# Patient Record
Sex: Female | Born: 1940 | Race: White | Hispanic: No | Marital: Married | Smoking: Former smoker
Health system: Southern US, Community
[De-identification: ages and names within clinical notes are randomized; demographics above are authoritative.]

## PROBLEM LIST (undated history)

## (undated) DIAGNOSIS — E785 Hyperlipidemia, unspecified: Secondary | ICD-10-CM

## (undated) DIAGNOSIS — I739 Peripheral vascular disease, unspecified: Secondary | ICD-10-CM

## (undated) DIAGNOSIS — E119 Type 2 diabetes mellitus without complications: Secondary | ICD-10-CM

## (undated) DIAGNOSIS — Z720 Tobacco use: Secondary | ICD-10-CM

## (undated) DIAGNOSIS — I10 Essential (primary) hypertension: Secondary | ICD-10-CM

## (undated) DIAGNOSIS — I779 Disorder of arteries and arterioles, unspecified: Secondary | ICD-10-CM

## (undated) DIAGNOSIS — M810 Age-related osteoporosis without current pathological fracture: Secondary | ICD-10-CM

## (undated) HISTORY — DX: Peripheral vascular disease, unspecified: I73.9

## (undated) HISTORY — DX: Age-related osteoporosis without current pathological fracture: M81.0

## (undated) HISTORY — DX: Disorder of arteries and arterioles, unspecified: I77.9

## (undated) HISTORY — DX: Hyperlipidemia, unspecified: E78.5

## (undated) HISTORY — DX: Tobacco use: Z72.0

## (undated) HISTORY — DX: Type 2 diabetes mellitus without complications: E11.9

## (undated) HISTORY — DX: Essential (primary) hypertension: I10

---

## 2000-08-09 ENCOUNTER — Ambulatory Visit: Admission: RE | Admit: 2000-08-09 | Discharge: 2000-08-09 | Payer: Self-pay | Admitting: Cardiovascular Disease

## 2000-08-09 ENCOUNTER — Encounter: Payer: Self-pay | Admitting: Cardiovascular Disease

## 2000-08-09 HISTORY — PX: OTHER SURGICAL HISTORY: SHX169

## 2003-08-15 ENCOUNTER — Ambulatory Visit (HOSPITAL_COMMUNITY): Admission: RE | Admit: 2003-08-15 | Discharge: 2003-08-15 | Payer: Self-pay | Admitting: Cardiovascular Disease

## 2003-08-15 HISTORY — PX: OTHER SURGICAL HISTORY: SHX169

## 2008-08-09 ENCOUNTER — Ambulatory Visit (HOSPITAL_BASED_OUTPATIENT_CLINIC_OR_DEPARTMENT_OTHER): Admission: RE | Admit: 2008-08-09 | Discharge: 2008-08-09 | Payer: Self-pay | Admitting: Urology

## 2008-11-19 ENCOUNTER — Ambulatory Visit (HOSPITAL_COMMUNITY): Admission: RE | Admit: 2008-11-19 | Discharge: 2008-11-19 | Payer: Self-pay | Admitting: Cardiovascular Disease

## 2010-07-11 IMAGING — CT CT ANGIO AOBIFEM WO/W CM
2 of 12 series · 12 of 46 positions shown, 18 images · IV contrast (APPLIED)
Comparison: None

CLINICAL DATA: Claudication

CT ANGIOGRAPHY OF ABDOMINAL AORTA WITH ILIOFEMORAL RUNOFF
TECHNIQUE: Multidetector CT imaging of the abdomen, pelvis and
lower extremities was performed using the standard protocol during
bolus administration of intravenous contrast. Multiplanar CT image
reconstructions including MIPs were obtained to evaluate the
vascular anatomy.
Contrast: 120 ml Omnipaque 350

[Series 5: celiac to knee 5.0 b31f st · axial · 0.66mm/px · z∈[-822,-152]mm · 11 of 160 slices shown, 16 images]
[im 13/160  soft-tissue]
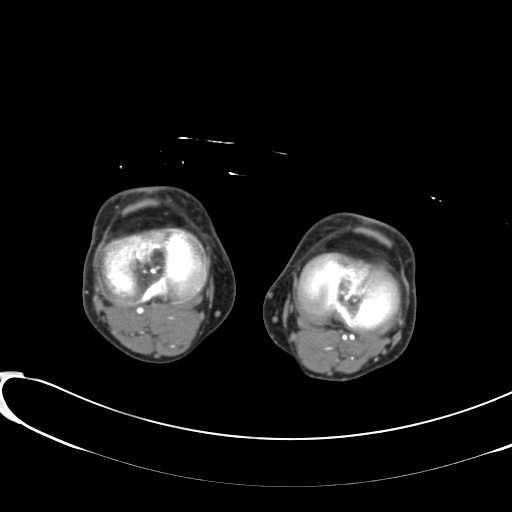
[im 13/160  bone]
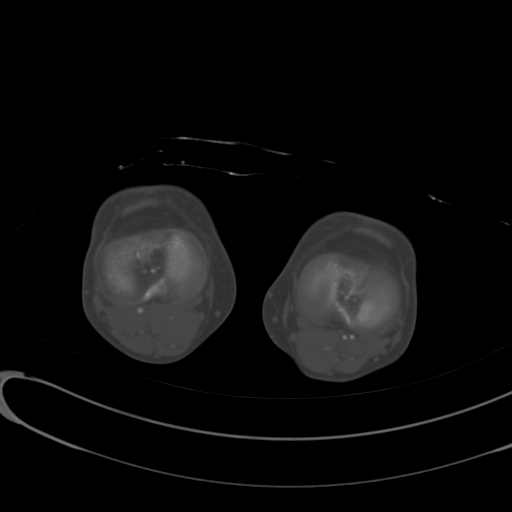
[im 25/160  soft-tissue]
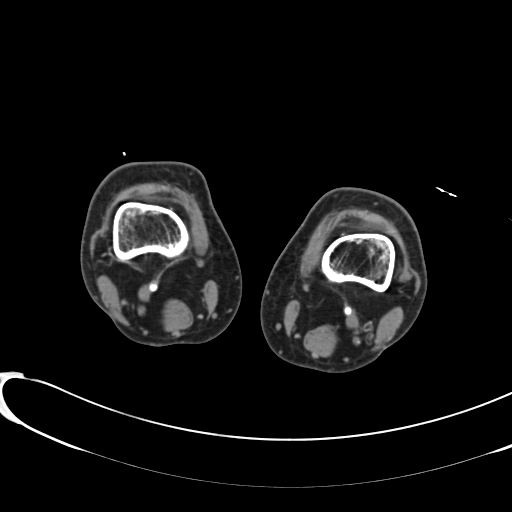
[im 49/160  soft-tissue]
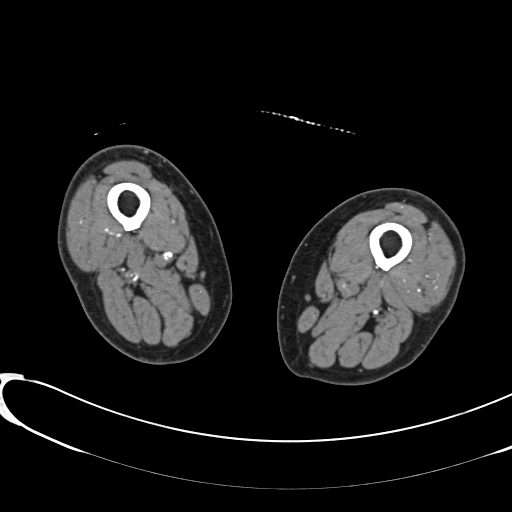
[im 62/160  soft-tissue]
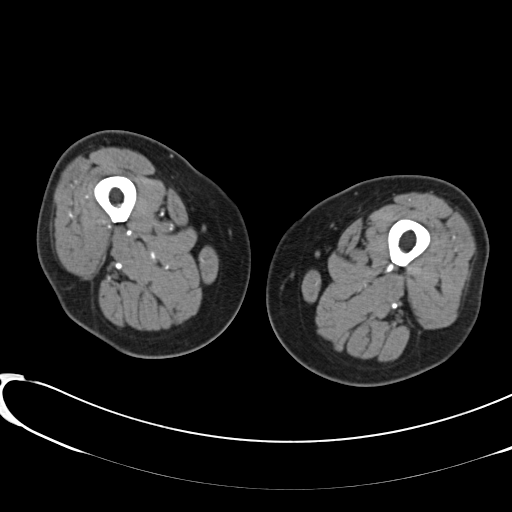
[im 74/160  soft-tissue]
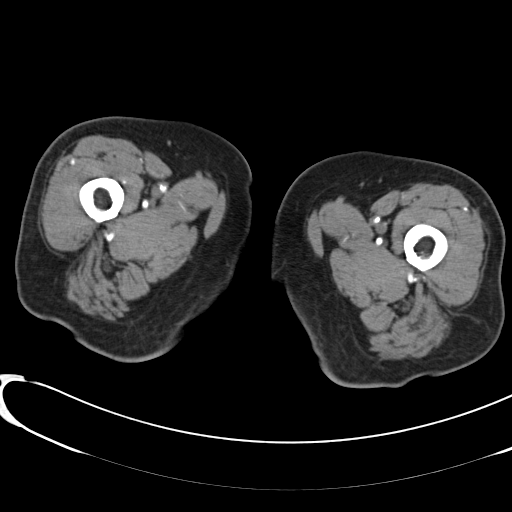
[im 86/160  soft-tissue]
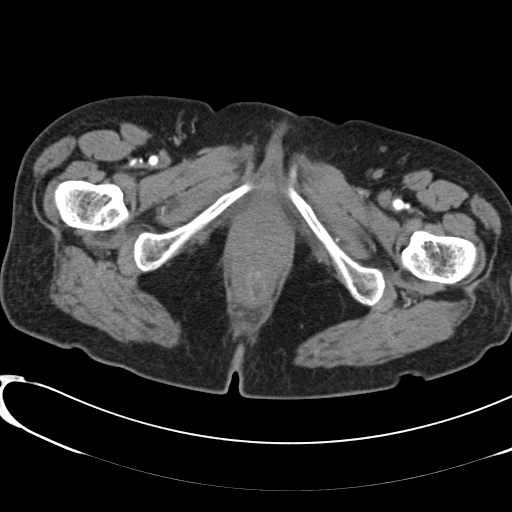
[im 98/160  soft-tissue]
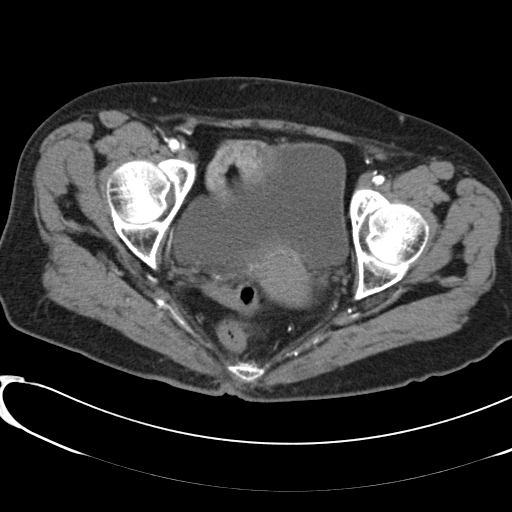
[im 111/160  lung]
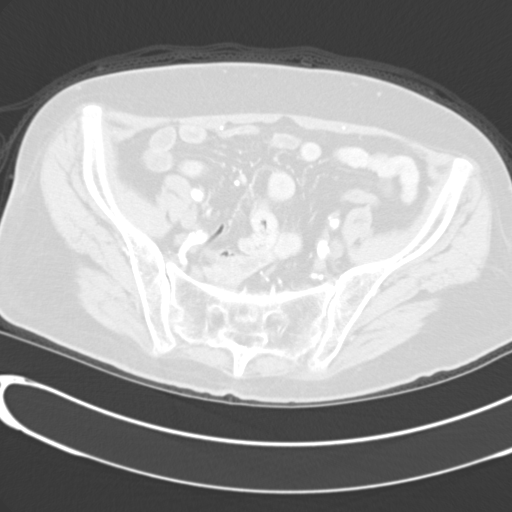
[im 123/160  soft-tissue]
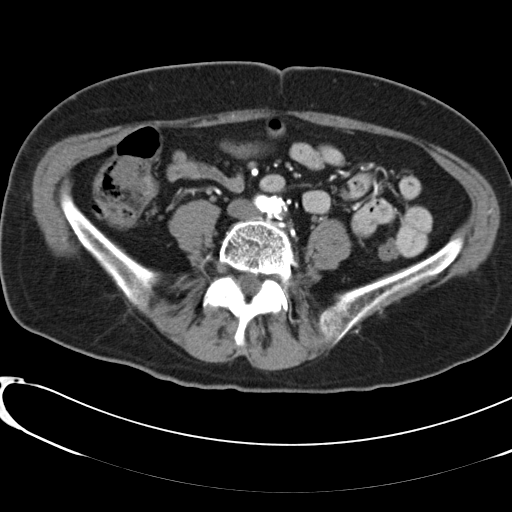
[im 123/160  lung]
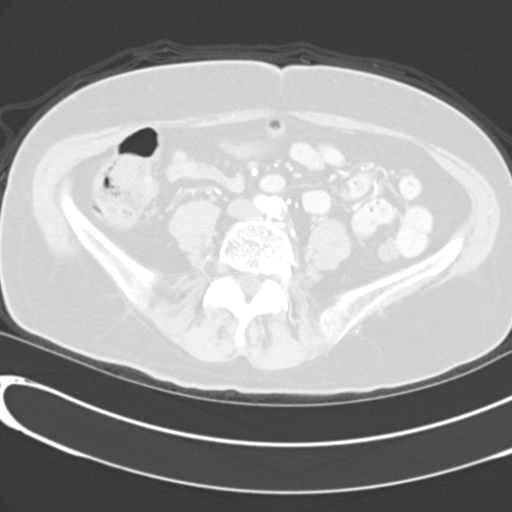
[im 135/160  soft-tissue]
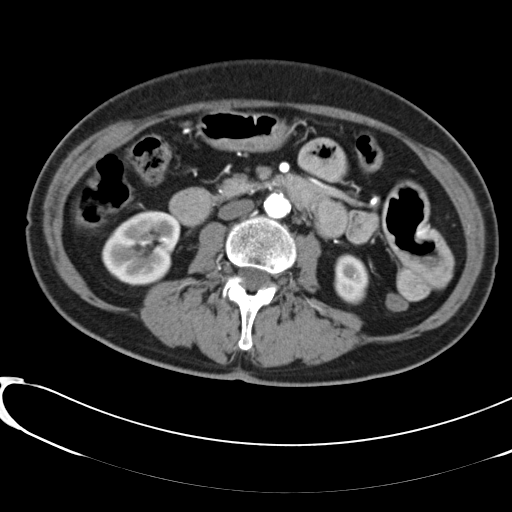
[im 135/160  lung]
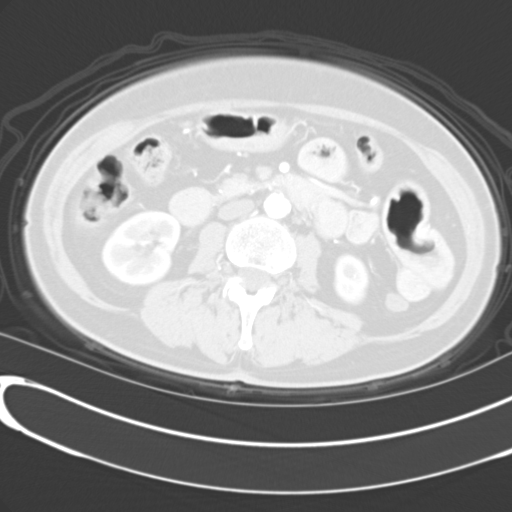
[im 135/160  bone]
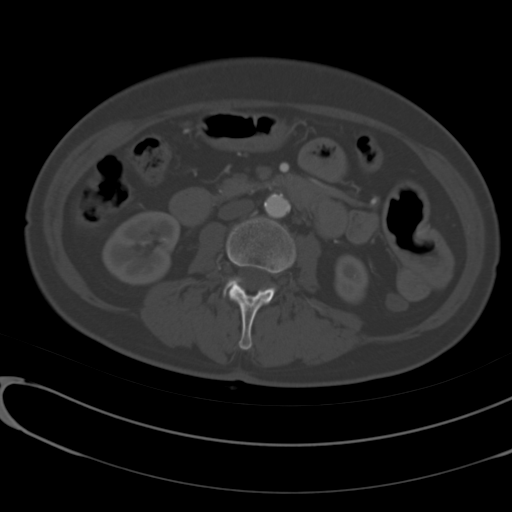
[im 147/160  soft-tissue]
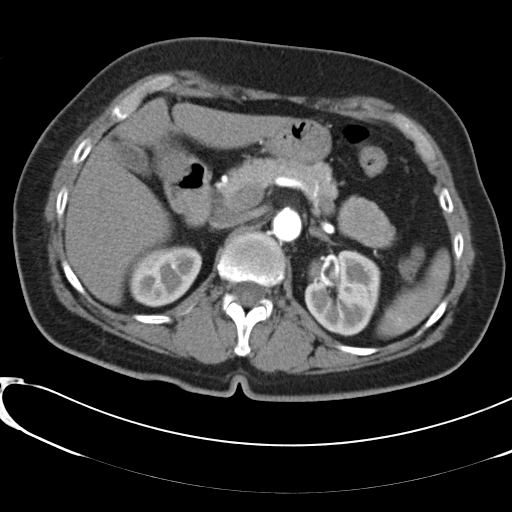
[im 147/160  lung]
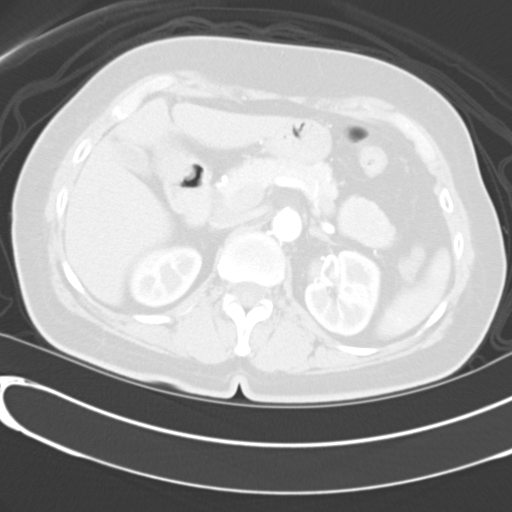

[Series 602: <mpr coronals c-k · coronal · 1.56mm/px · 1 of 76 slices shown, 2 images]
[im 38/76  soft-tissue]
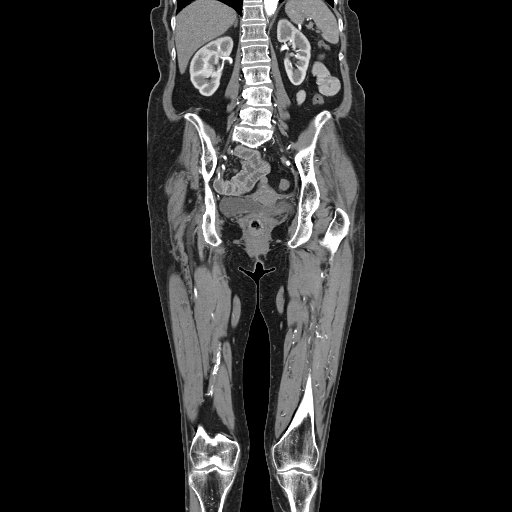
[im 38/76  bone]
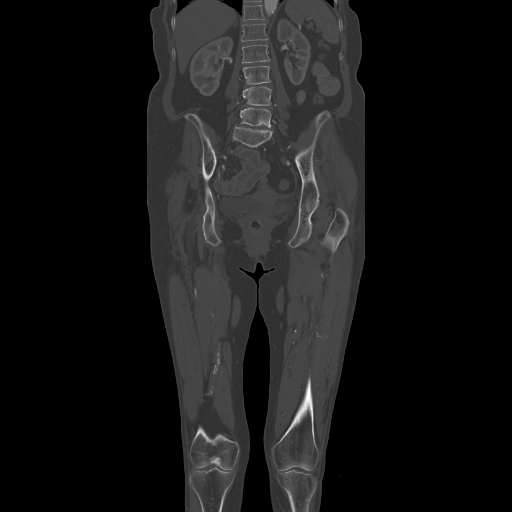

[12 of 46 positions shown; findings below may reference images not displayed]

FINDINGS: The aorta is nonaneurysmal and patent with diffuse atherosclerotic
changes distally.  Minimal disease at the origins of the celiac and
SMA.  Moderate disease at the origin of the IMA.  Mild disease at
the origin of the right renal artery.  The left renal artery is
patent.

There are significant and diffuse atherosclerotic changes of the
right common and external iliac artery without significant focal
stenosis.  There is moderate disease at the origin of the right
internal iliac artery.

The left common iliac artery is occluded just beyond its origin.
It reconstitutes just above the bifurcation.  The proximal left
external iliac artery is moderately diseased without significant
focal stenosis.  There is a significant stenosis at the origin of
the left internal iliac artery, approximately 80%.

Right lower extremity runoff demonstrates that the right common
femoral and profunda femoral arteries are patent.  The right
superficial femoral artery is occluded in the proximal thigh.  It
reconstitutes well above the knee joint.  Popliteal artery is
patent.  Three-vessel runoff to the right ankle.

The left common femoral artery is patent.  The left superficial
femoral artery is occluded at its origin.  The left popliteal
artery reconstitutes well above the knee joint.  There is a high
takeoff for the left anterior tibial artery.  The branch point
occurs at the knee joint.  There is two vessel runoff to the left
ankle via the anterior tibial and posterior tibial arteries.  There
is a short segment occlusion of the left peroneal artery just
beyond its origin.

Moderate hiatal hernia.  Coronary artery calcifications.  The
liver, gallbladder, spleen, pancreas, and adrenal glands are within
normal limits.  Negative free fluid.  Prominent degenerative disc
disease at L5-S1 and L4-5.  Small umbilical hernia containing
adipose tissue.
IMPRESSION: Aorta, right common iliac, and right external iliac artery are
patent and without significant focal stenosis.  Atherosclerotic
changes are noted.

Occlusion of the left common iliac artery just beyond its origin.
It reconstitutes above the bifurcation into the internal and
external iliac branches.

Bilateral superficial femoral artery occlusion.  Three vessel
runoff to the right ankle. Two vessel runoff to the left ankle.

## 2010-07-28 HISTORY — PX: OTHER SURGICAL HISTORY: SHX169

## 2011-05-11 NOTE — Op Note (Signed)
Shelby Perkins, Shelby Perkins            ACCOUNT NO.:  [id number]   MEDICAL RECORD NO.:  [id number]          PATIENT TYPE:  AMB   LOCATION:  [facility]                         FACILITY:  [facility]   PHYSICIAN:  Sigmund I. Patsi Sears, M.D.DATE OF BIRTH:  [birth year]   DATE OF PROCEDURE:  DATE OF DISCHARGE:                               OPERATIVE REPORT   PREOPERATIVE DIAGNOSIS:  Stress urinary continence.   POSTOPERATIVE DIAGNOSIS:  Stress urinary continence.   OPERATION:  Presenter, broadcasting transvaginal sling.   SURGEON:  Sigmund I. Patsi Sears, M.D.   ANESTHESIA:  General LMA.   PREPARATION:  After appropriate preanesthesia the patient was brought to  the operating room and placed on the operating table in dorsal supine  position where general LMA anesthesia was induced.  She was then  replaced in the dorsal lithotomy position where the pubis was prepped  with Betadine solution and draped in the usual fashion.  Foley catheter  was placed.  A posterior weighted speculum was placed in a protected  fashion.   REVIEW OF HISTORY:  This 70 year old female has type 2 diabetes and  complains of cough/sneeze incontinence.  She wears five pads per day and  her problems have increased in the last 1-1/2 years, particularly in the  last 6 months.  She is para 3-3-0.  She was found to have a positive  Marshall test and positive Q-tip test on office examination.  She is now  for Solyx transvaginal sling.   PROCEDURE IN DETAIL:  Inspection of the vagina showed normal  estrogenization, with no evidence of cystocele, enterocele or rectocele.  The urethra was hypermobile but there was no definite urethrocele.   Five mL of Marcaine with epinephrine 1:200,000 was injected in the  periurethral space and a midurethral incision was made measuring 1.5 cm.  Subcutaneous tissue was dissected with sharp and blunt dissection.  The  Solyx was marked at halfway and two areas were marked at 5 cm lateral to  the  clitoris and the obturator fossa as aiming points.  The Solyx was  then placed, first on the right side, through the incision, up behind  the pubis, into the obturator internus muscle and released.  Second side  was then accomplished on the left side, placing the Solyx sling into the  left obturator internus in the same fashion.  The wound was irrigated  with antibiotic irrigation.  Inspection revealed pillowing of the tissue  through the sling and a right-angle clamp was easily placed behind the  sling.  The sling was then released from the second side.  Cystoscopy  revealed normal-appearing bladder with no evidence of any bladder  trauma.  The patient tolerated the procedure well.  She was given a B&O  suppository at the beginning of the procedure and IV Toradol at the end  of the procedure.  She was awakened and taken to the recovery room in  good condition.  No Foley catheter and no packing was left.      Sigmund I. Patsi Sears, M.D.  Electronically Signed     SIT/MEDQ  D:  08/09/2008  T:  08/09/2008  Job:  [id number]

## 2011-05-14 NOTE — Cardiovascular Report (Signed)
NAME:  Shelby Perkins, Shelby Perkins NO.:  [id number]   MEDICAL RECORD NO.:  [id number]                   PATIENT TYPE:  OIB   LOCATION:  2899                                 FACILITY:  [facility]   PHYSICIAN:  Nanetta Batty, M.D.                DATE OF BIRTH:  11/01/1941   DATE OF PROCEDURE:  DATE OF DISCHARGE:  08/15/2003                              CARDIAC CATHETERIZATION   INDICATIONS:  [name] is a 71 year old married white female with a  history of PVOD.  I studied her peripheral anatomy in 2001 revealing an  occluded left SFA with widely patent right SFA.  I suggested surgery at that  time.  However, she declined and continued to be treated medically.  History  includes diabetes, borderline hypertension, as well as hyperlipidemia.  She  was seen in the office with progression of her claudication symptoms.  Dopplers revealed ABIs of 0.5-6 bilaterally suggesting the possibility of  progression of disease on the right.  She presents now for angiography and  potential intervention.   PROCEDURE DESCRIPTION:  The patient was brought to the sixth floor [facility] in the postabsorptive state.  She was premedicated with p.o. Valium.  Her left groin was prepped and  shaved in the usual sterile fashion.  1% Xylocaine was used for local  anesthesia.  A 5-French sheath was inserted into the left femoral artery  using standard Seldinger technique.  A 5-French Tennis Racquet catheter was  used for midstream and distal abdominal aortography as well as bifemoral  runoff using bolus chase step table digital subtraction technique.  ICV dye  was used for the entirety of the case.  Retrograde aortic pressure was  monitored during the case.   ANGIOGRAPHIC RESULTS:  1. Abdominal aorta:     a. Renal arteries normal.     b. Infrarenal abdominal aorta normal.  2. Left lower extremity:     a. 60% short segmental left external iliac artery  stenosis.     b. Total left SFA at its origin reconstituting in the above the knee        popliteal/adductor canal by profunda femoris collaterals.  There is        three vessel runoff.  3. Right lower extremity:     a. 40% ostial right common iliac artery stenosis.     b. Occluded right SFA with reconstitution both in the popliteal/distal        SFA/adductor canal by profunda femoris collaterals.  There is three        vessel runoff.   IMPRESSION:  [name] peripheral vascular occlusive disease has  progressed in the last three years and she now has severe bilateral  infrainguinal disease with total SFA.  She has good above the knee targets  for fem-pop bypass grafting should she chose to pursue this route.   The sheath was removed and pressure  was held on the groin to achieve  hemostasis.  The patient left the laboratory in stable condition.  She will  be discharged home later today as an outpatient and I will see her back in  the office in approximately two weeks for follow-up.  She left the  laboratory in stable condition.                                               Nanetta Batty, M.D.    JB/MEDQ  D:  08/15/2003  T:  08/16/2003  Job:  [id number]   cc:   PV Angiographic Suite   [facility]   Renae Fickle  [address]  [place]  [place] [address]  Fax: [phone]

## 2011-05-14 NOTE — Cardiovascular Report (Signed)
[facility]  Patient:    Shelby Perkins                     MRN: [id number] Proc. Date: 08/09/00 Attending:  Runell Gess, M.D. CC:         [facility]  [facility], [place] [address] [place], [place] [address]  Renae Fickle, M.D., [facility]   Cardiac Catheterization  INDICATIONS:  Shelby Perkins is a 70 year old white female referred by Dr. Samuel Germany for evaluation of claudication.  She has a history of left iliac or SAF intervention in 1999 in [place].  She has had progressive claudication. There is no history of ischemic heart disease.  Dopplers performed during this hospitalization showed diminished left ABI.  She presents now for peripheral angiography and potential intervention.  DESCRIPTION OF PROCEDURE:  The patient was brought to the second floor [facility] in the postabsorptive state.  She was premedicated with p.o. Valium.  Her right groin was prepped and shaved in the usual sterile fashion.  Xylocaine 1% was used for local anesthesia.  A 6 French sheath was inserted into the right femoral artery using standard Seldinger technique.  A 5 French transthoracic catheter along with IMA inflow catheters were used for mid stream and distal abdominal aortography as well as bifemoral runoff.  Visipaque dye was used for the entirety of the case. Retrograde and aortic pressures were monitored during the case.  ANGIOGRAPHIC RESULTS: 1. Abdominal aorta:    a. Renal arteries--30% right renal artery stenosis.    b. Normal infrarenal abdominal aorta. 2. Left lower extremity:    a. A 50% segmental left external iliac artery stenosis without a       transstenotic gradient on pullback.    b. Total SFA with reconstitution with the distal SFA/popliteal on       profunda femoris collaterals and Hunter canal.    c. Two-vessel runoff. 3. Right lower  extremity:    a. A 40% fairly focal eccentric right common iliac stenosis.    b. A 50% segmental proximal right SFA.    c. Three-vessel runoff.  IMPRESSION:  Shelby Perkins has a total left superficial femoral artery which is amenable to femoral-popliteal bypass grafting.  There is no significant disease in her right lower extremity.  The sheaths were removed and pressure was held on the groin to achieve hemostasis.  The patient left the lab in stable condition.  She will be discharged home later today as an outpatient or to be back in the office in two weeks.  Dr. Renae Fickle was notified of these results. DD:  08/09/00 TD:  08/09/00 Job: [id number]

## 2011-09-24 LAB — GLUCOSE, CAPILLARY: Glucose-Capillary: 142 — ABNORMAL HIGH

## 2011-09-24 LAB — POCT I-STAT 4, (NA,K, GLUC, HGB,HCT)
Glucose, Bld: 111 — ABNORMAL HIGH
HCT: 45
Hemoglobin: 15.3 — ABNORMAL HIGH
Potassium: 4.2
Sodium: 140

## 2012-05-12 HISTORY — PX: CARDIOVASCULAR STRESS TEST: SHX262

## 2013-12-24 ENCOUNTER — Encounter: Payer: Self-pay | Admitting: Cardiovascular Disease

## 2013-12-24 ENCOUNTER — Encounter (HOSPITAL_COMMUNITY): Payer: Self-pay | Admitting: *Deleted

## 2013-12-24 ENCOUNTER — Ambulatory Visit (INDEPENDENT_AMBULATORY_CARE_PROVIDER_SITE_OTHER): Payer: Medicare Other | Admitting: Cardiovascular Disease

## 2013-12-24 VITALS — BP 122/66 | HR 91 | Ht 64.0 in | Wt 136.0 lb

## 2013-12-24 DIAGNOSIS — E785 Hyperlipidemia, unspecified: Secondary | ICD-10-CM | POA: Insufficient documentation

## 2013-12-24 DIAGNOSIS — I10 Essential (primary) hypertension: Secondary | ICD-10-CM

## 2013-12-24 DIAGNOSIS — I739 Peripheral vascular disease, unspecified: Secondary | ICD-10-CM

## 2013-12-24 DIAGNOSIS — E119 Type 2 diabetes mellitus without complications: Secondary | ICD-10-CM | POA: Insufficient documentation

## 2013-12-24 DIAGNOSIS — F17200 Nicotine dependence, unspecified, uncomplicated: Secondary | ICD-10-CM

## 2013-12-24 DIAGNOSIS — Z720 Tobacco use: Secondary | ICD-10-CM | POA: Insufficient documentation

## 2013-12-24 NOTE — Patient Instructions (Signed)
  We will see you back in follow up in 6 months with Dr Berry  Dr Berry has ordered lower extremity arterial dopplers   

## 2013-12-24 NOTE — Assessment & Plan Note (Signed)
On statin therapy. We will obtain her most recent lab work from her primary care physician

## 2013-12-24 NOTE — Assessment & Plan Note (Signed)
The patient was last examined by myself 08/15/03 revealing occluded SFAs bilaterally, 60% left external iliac artery stenosis and 40% right common iliac artery stenosis with three-vessel runoff. Subsequent to that she had lower extremity arterial Doppler studies performed 11/12/09 revealing a right ABI of 0.6 (57. Her left iliac was occluded and she had moderate right iliac disease. In May of 2013 she fell and fractured L5-S1 has been complaining of hip and groin pain. Apparently a CT scan recently done by Dr. Samuel Germany suggests occlusion of her right iliac. She is weak in her legs until asymptomatic though it is unclear how much her symptoms are orthopedic/neurologic versus vascular. She does have a palpable pulse in her right groin. I'm going to repeat lower extremity arterial Doppler studies.

## 2013-12-24 NOTE — Assessment & Plan Note (Signed)
Controlled on current medications 

## 2013-12-24 NOTE — Progress Notes (Signed)
12/24/2013 Shelby Perkins   Sep 01, 1941  [id number]  Primary Physician Pcp Not In System Primary Cardiologist: Runell Gess MD Shelby Perkins   HPI:  The patient is a 72 year old thin-appearing divorced Caucasian female, mother of 3, grandmother to 1 grandchild, who is accompanied by one of her sons today. I saw her on September 01, 2010, last. She has a history of PAD status post angiography by myself, August of 2004, revealing occluded SFAs bilaterally with a 60% left external iliac artery stenosis, which has since been shown to have occluded by duplex ultrasound. She does have lifestyle-limiting claudication. She does continue to smoke, now up to 10 cigarettes per day. Her other problems include hypertension, hyperlipidemia, and non-insulin-requiring diabetes. She denies chest pain or shortness of breath. She fell prior to her last visit and fractured her sacrum (L5 to S1) and apparently is symptomatic and requires surgery.  Since I saw her last a year and a half ago she's had progressive symptoms in her back hip and groin area with weakness in her legs. Apparently Dr. Samuel Germany did a CT scan which suggested occlusion of her right iliac as well I referred her back for further evaluation     Current Outpatient Prescriptions  Medication Sig Dispense Refill  . aspirin EC 81 MG tablet Take 81 mg by mouth daily.      . cilostazol (PLETAL) 100 MG tablet Take 100 mg by mouth 2 (two) times daily.      . fexofenadine (ALLEGRA) 180 MG tablet Take 180 mg by mouth daily.      Marland Kitchen glyBURIDE-metformin (GLUCOVANCE) 5-500 MG per tablet Take 2 tablets by mouth daily with breakfast.       . lisinopril (PRINIVIL,ZESTRIL) 20 MG tablet Take 20 mg by mouth daily.      Marland Kitchen lovastatin (MEVACOR) 40 MG tablet Take 40 mg by mouth at bedtime.      Marland Kitchen omeprazole (PRILOSEC) 20 MG capsule Take 40 mg by mouth daily.      . traMADol (ULTRAM) 50 MG tablet Take 50 mg by mouth every 6 (six) hours as needed.        . vitamin B-12 (CYANOCOBALAMIN) 1000 MCG tablet Take 1,000 mcg by mouth daily.      . Vitamin D, Ergocalciferol, (DRISDOL) 50000 UNITS CAPS capsule Take 50,000 Units by mouth every 7 (seven) days.       No current facility-administered medications for this visit.    No Known Allergies  History   Social History  . Marital Status: Married    Spouse Name: N/A    Number of Children: N/A  . Years of Education: N/A   Occupational History  . Not on file.   Social History Main Topics  . Smoking status: Current Every Day Smoker    Types: Cigarettes  . Smokeless tobacco: Not on file  . Alcohol Use: Not on file  . Drug Use: Not on file  . Sexual Activity: Not on file   Other Topics Concern  . Not on file   Social History Narrative  . No narrative on file     Review of Systems: General: negative for chills, fever, night sweats or weight changes.  Cardiovascular: negative for chest pain, dyspnea on exertion, edema, orthopnea, palpitations, paroxysmal nocturnal dyspnea or shortness of breath Dermatological: negative for rash Respiratory: negative for cough or wheezing Urologic: negative for hematuria Abdominal: negative for nausea, vomiting, diarrhea, bright red blood per rectum, melena, or hematemesis Neurologic: negative for  visual changes, syncope, or dizziness All other systems reviewed and are otherwise negative except as noted above.    Blood pressure 122/66, pulse 91, height 5\' 4"  (1.626 m), weight 136 lb (61.689 kg).  General appearance: alert and no distress Neck: no adenopathy, no carotid bruit, no JVD, supple, symmetrical, trachea midline and thyroid not enlarged, symmetric, no tenderness/mass/nodules Lungs: clear to auscultation bilaterally Heart: regular rate and rhythm, S1, S2 normal, no murmur, click, rub or gallop Extremities: extremities normal, atraumatic, no cyanosis or edema and one plus right femoral pulse without bruit, absent left femoral pulse  EKG  normal sinus rhythm at 91 without ST or T wave changes  ASSESSMENT AND PLAN:   Peripheral arterial disease The patient was last examined by myself 08/15/03 revealing occluded SFAs bilaterally, 60% left external iliac artery stenosis and 40% right common iliac artery stenosis with three-vessel runoff. Subsequent to that she had lower extremity arterial Doppler studies performed 11/12/09 revealing a right ABI of 0.6 (57. Her left iliac was occluded and she had moderate right iliac disease. In May of 2013 she fell and fractured L5-S1 has been complaining of hip and groin pain. Apparently a CT scan recently done by Dr. Samuel Germany suggests occlusion of her right iliac. She is weak in her legs until asymptomatic though it is unclear how much her symptoms are orthopedic/neurologic versus vascular. She does have a palpable pulse in her right groin. I'm going to repeat lower extremity arterial Doppler studies.  Essential hypertension Controlled on current medications  Hyperlipidemia On statin therapy. We will obtain her most recent lab work from her primary care physician      Runell Gess MD [place], [facility] 12/24/2013 11:03 AM\Jonathan Erlene Quan MD FACP,FACC,FAHA, [facility]

## 2014-01-03 ENCOUNTER — Ambulatory Visit (HOSPITAL_COMMUNITY)
Admission: RE | Admit: 2014-01-03 | Discharge: 2014-01-03 | Disposition: A | Discharge status: Home / Self Care | Payer: Medicare Other | Source: Ambulatory Visit | Attending: Cardiovascular Disease | Admitting: Cardiovascular Disease

## 2014-01-03 DIAGNOSIS — F17200 Nicotine dependence, unspecified, uncomplicated: Secondary | ICD-10-CM | POA: Insufficient documentation

## 2014-01-03 DIAGNOSIS — E119 Type 2 diabetes mellitus without complications: Secondary | ICD-10-CM | POA: Insufficient documentation

## 2014-01-03 DIAGNOSIS — I739 Peripheral vascular disease, unspecified: Secondary | ICD-10-CM

## 2014-01-03 DIAGNOSIS — I70219 Atherosclerosis of native arteries of extremities with intermittent claudication, unspecified extremity: Secondary | ICD-10-CM

## 2014-01-03 NOTE — Progress Notes (Signed)
Lower extremity arterial duplex completed.  [facility]

## 2014-02-27 ENCOUNTER — Encounter: Payer: Self-pay | Admitting: Cardiovascular Disease

## 2014-02-27 ENCOUNTER — Ambulatory Visit (INDEPENDENT_AMBULATORY_CARE_PROVIDER_SITE_OTHER): Payer: Medicare Other | Admitting: Cardiovascular Disease

## 2014-02-27 VITALS — BP 118/56 | HR 92 | Ht 64.0 in | Wt 133.2 lb

## 2014-02-27 DIAGNOSIS — I739 Peripheral vascular disease, unspecified: Secondary | ICD-10-CM

## 2014-02-27 DIAGNOSIS — E785 Hyperlipidemia, unspecified: Secondary | ICD-10-CM

## 2014-02-27 DIAGNOSIS — I10 Essential (primary) hypertension: Secondary | ICD-10-CM

## 2014-02-27 NOTE — Assessment & Plan Note (Signed)
On statin therapy followed by her PCP 

## 2014-02-27 NOTE — Assessment & Plan Note (Signed)
Well-controlled on current medications 

## 2014-02-27 NOTE — Progress Notes (Signed)
02/27/2014 Shelby Perkins   1941/08/14  [id number]  Primary Physician Pcp Not In System Primary Cardiologist: Runell GessJonathan J. Johnnisha Forton MD Roseanne RenoFACP,FACC,FAHA, FSCAI   HPI:  The patient is a 73 year old thin-appearing divorced Caucasian female, mother of 3, grandmother to 1 grandchild, who is accompanied by one of her sons today. I saw her on September 01, 2010, last. She has a history of PAD status post angiography by myself, August of 2004, revealing occluded SFAs bilaterally with a 60% left external iliac artery stenosis, which has since been shown to have occluded by duplex ultrasound. She does have lifestyle-limiting claudication. She does continue to smoke, now up to 10 cigarettes per day. Her other problems include hypertension, hyperlipidemia, and non-insulin-requiring diabetes. She denies chest pain or shortness of breath. She fell prior to her last visit and fractured her sacrum (L5 to S1) and apparently is symptomatic and requires surgery. Since I saw her last a year and a half ago she's had progressive symptoms in her back hip and groin area with weakness in her legs. Apparently Dr. Samuel GermanyGage did a CT scan which suggested occlusion of her right iliac and she was referred back for further evaluation. Lower extremity arterial Doppler studies performed office 01/03/14 revealed an occluded left common iliac artery which was a new finding.she really is minimally ambulatory and therefore is unable to differentiate whether or not her claudication has worsened on one side versus the other. She has a herniated disc which is symptomatic from.   Current Outpatient Prescriptions  Medication Sig Dispense Refill  . aspirin EC 81 MG tablet Take 81 mg by mouth daily.      . cilostazol (PLETAL) 100 MG tablet Take 100 mg by mouth 2 (two) times daily.      . fexofenadine (ALLEGRA) 180 MG tablet Take 180 mg by mouth daily.      Marland Kitchen. glyBURIDE-metformin (GLUCOVANCE) 5-500 MG per tablet Take 2 tablets by mouth daily with  breakfast.       . HYDROcodone-acetaminophen (NORCO/VICODIN) 5-325 MG per tablet Take 1 tablet by mouth at bedtime.      Marland Kitchen. lisinopril (PRINIVIL,ZESTRIL) 20 MG tablet Take 20 mg by mouth daily.      Marland Kitchen. lovastatin (MEVACOR) 40 MG tablet Take 40 mg by mouth at bedtime.      Marland Kitchen. omeprazole (PRILOSEC) 20 MG capsule Take 40 mg by mouth daily.      . vitamin B-12 (CYANOCOBALAMIN) 1000 MCG tablet Take 1,000 mcg by mouth daily.      . traMADol (ULTRAM) 50 MG tablet Take 50 mg by mouth every 6 (six) hours as needed.      . Vitamin D, Ergocalciferol, (DRISDOL) 50000 UNITS CAPS capsule Take 50,000 Units by mouth every 7 (seven) days.       No current facility-administered medications for this visit.    No Known Allergies  History   Social History  . Marital Status: Married    Spouse Name: N/A    Number of Children: N/A  . Years of Education: N/A   Occupational History  . Not on file.   Social History Main Topics  . Smoking status: Current Every Day Smoker    Types: Cigarettes  . Smokeless tobacco: Not on file  . Alcohol Use: Not on file  . Drug Use: Not on file  . Sexual Activity: Not on file   Other Topics Concern  . Not on file   Social History Narrative  . No narrative on file  Review of Systems: General: negative for chills, fever, night sweats or weight changes.  Cardiovascular: negative for chest pain, dyspnea on exertion, edema, orthopnea, palpitations, paroxysmal nocturnal dyspnea or shortness of breath Dermatological: negative for rash Respiratory: negative for cough or wheezing Urologic: negative for hematuria Abdominal: negative for nausea, vomiting, diarrhea, bright red blood per rectum, melena, or hematemesis Neurologic: negative for visual changes, syncope, or dizziness All other systems reviewed and are otherwise negative except as noted above.    Blood pressure 118/56, pulse 92, height 5\' 4"  (1.626 m), weight 60.419 kg (133 lb 3.2 oz).  General appearance:  alert and no distress Neck: no adenopathy, no carotid bruit, no JVD, supple, symmetrical, trachea midline and thyroid not enlarged, symmetric, no tenderness/mass/nodules Lungs: clear to auscultation bilaterally Heart: regular rate and rhythm, S1, S2 normal, no murmur, click, rub or gallop Extremities: edema no edema  EKG not performed today  ASSESSMENT AND PLAN:   Essential hypertension Well-controlled on current medications  Hyperlipidemia On statin therapy followed by her PCP  Peripheral arterial disease The patient has known SFA occlusion bilaterally. 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 She does have disc in her L5-S1 disc space with pain and weakness. She really does not walk much and is hard to differentiate whether she has claudication or not. At this point my inclination is to treat her medically.      Runell Gess MD FACP,FACC,FAHA, [facility] 02/27/2014 2:50 PM

## 2014-02-27 NOTE — Assessment & Plan Note (Signed)
The patient has known SFA occlusion bilaterally. 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 She does have disc in her L5-S1 disc space with pain and weakness. She really does not walk much and is hard to differentiate whether she has claudication or not. At this point my inclination is to treat her medically.

## 2014-02-27 NOTE — Patient Instructions (Signed)
Your physician recommends that you schedule a follow-up appointment in: 6 Months  Your physician has requested that you have a lower extremity arterial duplex. This test is an ultrasound of the arteries in the legs. It looks at arterial blood flow in the legs. Allow one hour for Lower Arterial scans. There are no restrictions or special instructions 1 Year

## 2014-10-28 ENCOUNTER — Encounter: Payer: Self-pay | Admitting: Neurology

## 2014-10-28 ENCOUNTER — Ambulatory Visit (INDEPENDENT_AMBULATORY_CARE_PROVIDER_SITE_OTHER): Payer: Medicare Other | Admitting: Neurology

## 2014-10-28 VITALS — BP 141/81 | HR 107 | Temp 98.2°F | Ht 64.0 in | Wt 110.0 lb

## 2014-10-28 DIAGNOSIS — R413 Other amnesia: Secondary | ICD-10-CM

## 2014-10-28 DIAGNOSIS — I739 Peripheral vascular disease, unspecified: Secondary | ICD-10-CM

## 2014-10-28 DIAGNOSIS — Z87891 Personal history of nicotine dependence: Secondary | ICD-10-CM

## 2014-10-28 DIAGNOSIS — R269 Unspecified abnormalities of gait and mobility: Secondary | ICD-10-CM

## 2014-10-28 NOTE — Progress Notes (Signed)
Subjective:    Patient ID: Shelby Perkins is a 73 y.o. female.  HPI     Shelby FoleySaima Marvena Tally, MD, PhD [facility] [address], [address] [address] [place], [place] [id number]  Dear Dr. Venetia MaxonStern,   I saw your patient, Shelby Perkins, upon your kind request in the neurologic clinic today for initial consultation of her cognitive decline. The patient is accompanied by her son, Shelby Perkins today. 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 She has been losing weight. She does not eat well. She may not drink enough water. She lives with her son and has done so since 2009. Her son is away for work and rarely out of town. She has had PT, but finished it prematurely as I understand.   She denies VH/AH, delusions.  There is no FHx of dementia. She has not been driving since last year.  She drinks mountain Dew about 1 l per day and coffee.  He does not report any recent falls but she does endorse having fallen twice in the last month or so. She does not endorse having injured herself. She does not have any supervision during the day. They are looking into getting more help at the house. He is also looking into getting her a call alert button.   The patient denies prior TIA or stroke symptoms, such as sudden onset of one sided weakness, numbness, tingling, slurring of speech or droopy face, hearing loss, tinnitus, diplopia or visual field cut or monocular loss of vision, and denies recurrent headaches.  Of note, the patient denies snoring, and there is no report of witnessed  apneas.   Her Past Medical History Is Significant For: Past Medical History  Diagnosis Date  . Hyperlipidemia   . Hypertension   . Peripheral arterial occlusive disease   . Diabetes mellitus without complication   . Osteoporosis   . Claudication   . Tobacco abuse     Her Past Surgical History Is Significant For: Past Surgical History  Procedure Laterality Date  . Peripheral vascular angiogram  08/15/2003    Recommend fem-pop bypass  . Peripheral vascular angiogram  08/09/2000    Recommend fem-pop bypass  . Carotid doppler  07/28/2010    No bilateral ICA stenosis. Moderate Rt ECA stenosis.  . Cardiovascular stress test  05/12/2012    No significant wall motion abnormalities noted.    Her Family History Is Significant For: Family History  Problem Relation Age of Onset  . Heart attack Mother     Her Social History Is Significant For: History   Social History  . Marital Status: Married    Spouse Name: N/A    Number of Children: 3  . Years of Education: N/A   Social History Main Topics  . Smoking status: Former Smoker    Types: Cigarettes  . Smokeless tobacco: Never Used  . Alcohol Use: No  . Drug Use: No  . Sexual Activity: None   Other Topics Concern  . None   Social History Narrative    Her Allergies Are:  No Known Allergies:   Her Current Medications Are:  Outpatient Encounter  Prescriptions as of 10/28/2014  Medication Sig  . aspirin EC 81 MG tablet Take 81 mg by mouth daily.  Marland Kitchen buPROPion (WELLBUTRIN SR) 150 MG 12 hr tablet   . cilostazol (PLETAL) 100 MG tablet Take 100 mg by mouth 2 (two) times daily.  Marland Kitchen CINNAMON PO Take 1,000 mg by mouth daily.  . fexofenadine (ALLEGRA) 180 MG tablet Take 180 mg by mouth daily.  Marland Kitchen glyBURIDE-metformin (GLUCOVANCE) 5-500 MG per tablet Take 2 tablets by mouth daily with breakfast.   . HYDROcodone-acetaminophen (NORCO/VICODIN) 5-325 MG per tablet Take 1 tablet by mouth at bedtime.  Marland Kitchen lisinopril (PRINIVIL,ZESTRIL) 20 MG  tablet Take 20 mg by mouth daily.  Marland Kitchen lovastatin (MEVACOR) 40 MG tablet Take 40 mg by mouth at bedtime.  Marland Kitchen omeprazole (PRILOSEC) 20 MG capsule Take 40 mg by mouth daily.  . traMADol (ULTRAM) 50 MG tablet Take 50 mg by mouth every 6 (six) hours as needed.  . vitamin B-12 (CYANOCOBALAMIN) 1000 MCG tablet Take 1,000 mcg by mouth daily.  . Vitamin D, Ergocalciferol, (DRISDOL) 50000 UNITS CAPS capsule Take 50,000 Units by mouth every 7 (seven) days.  . [DISCONTINUED] B Complex Vitamins (B-COMPLEX/B-12 PO) Take 2,500 mg by mouth daily.  . [DISCONTINUED] cilostazol (PLETAL) 50 MG tablet   :  Review of Systems:  Out of a complete 14 point review of systems, all are reviewed and negative with the exception of these symptoms as listed below:  Review of Systems  Eyes:       Loss of vision  Gastrointestinal:       Incontinence  Endocrine: Positive for cold intolerance.  Genitourinary:       Urination problems  Musculoskeletal:       Joint pain, cramps  Neurological:       Restless legs  Psychiatric/Behavioral:       Too much sleep, decreased energy, change in appetite    Objective:  Neurologic Exam  Physical Exam Physical Examination:   Filed Vitals:   10/28/14 1431  BP: 141/81  Pulse: 107  Temp: 98.2 F (36.8 C)    General Examination: The patient is a very pleasant 73 y.o. female in no acute distress. She is calm and cooperative with the exam. She denies Auditory Hallucinations and Visual Hallucinations. She is adequately groomed and situated in a wheelchair. She appears frail and deconditioned.  HEENT: Normocephalic, atraumatic, pupils are equal, round and reactive to light and accommodation. Funduscopic exam is normal with sharp disc margins noted. Extraocular tracking shows mild saccadic breakdown without nystagmus noted. Hearing is impaired mildly. Face is symmetric with no significant facial masking and normal facial sensation. There is no lip, neck or jaw tremor. Neck is not  rigid with intact passive ROM. There are no carotid bruits on auscultation. Oropharynx exam reveals moderate mouth dryness and mild pharyngeal erythema. No significant airway crowding is noted. Mallampati is class I. Tongue protrudes centrally and palate elevates symmetrically.    Chest: is clear to auscultation without wheezing, rhonchi or crackles noted.  Heart: sounds are regular and normal without murmurs, rubs or gallops noted.   Abdomen: is soft, non-tender and non-distended with normal bowel sounds appreciated on auscultation.  Extremities: There is no pitting edema in the distal lower extremities bilaterally. Pedal pulses are intact.   Skin: is warm and dry with no trophic changes noted. Age-related changes are noted on the skin.   Musculoskeletal: exam reveals no obvious joint deformities, tenderness or joint swelling or erythema. 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   Neurologically:  Mental status: The patient is awake and alert, paying fair  attention. She is able to partially provide the history. His son provides most of the history. Her memory, attention, language and knowledge are impaired. There is no aphasia, agnosia, apraxia or anomia. There is a mild degree of bradyphrenia. Speech is mildly hypophonic with no dysarthria noted. Mood is mildly depressed appearing and affect is blunted.  Her MMSE (Mini-Mental state exam) score is 19/30.  CDT (Clock Drawing Test) score is 3/4.  AFT (Animal Fluency Test) score is 7.  Geriatric Depression Scale Score is 8.   Cranial nerves are as described above under HEENT exam. In addition, shoulder shrug is normal with equal shoulder height noted.  Motor exam:she appears deconditioned and frail. Bulk is thin throughout. She has a global strength of about 4+ out of 5. She has mild decrease in pinprick and temperature sense in the distal left lower extremity laterally. Reflexes are 2+ in the upper extremities trace in  her knees and 1+ in the ankles with toes downgoing. She has no difficulty with fine motor skills or any evidence of parkinsonism. Romberg is not tested. She stands with difficulty and has to push herself up. She stands insecurely and wide-based. I did not have her walk for me today as she did not bring her walker. She is able to do heel-to-shin testing as well as finger to nose testing without significant problems.  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 Upon examination she certainly does have memory loss. Her MMSE is in the realm beyond age-appropriate memory loss or mild cognitive impairment. Given her history she is at risk for vascular dementia. At this juncture I would like to proceed with further testing in the form of full neurocognitive evaluation an MRI brain. I did not suggest any new medications but I did suggest that we may start a dementia medication in the near future. I would also like to see if she had recent B12 check. She indicated that she had blood work through her primary care physician, Dr. Samuel GermanyGage. I will copy her primary care physician. I would recommend thyroid function test and B12 level check which may have been done recently. I do see that she is taking a vitamin B12 supplement orally.  I had a long chat with the patient and her son about my findings and the diagnosis of memory loss and dementia, its prognosis and treatment options. Implications of diagnosis explained at length with the patient and caregiver.. We talked about medical  treatments and non-pharmacological approaches. We talked about smoking cessation and about maintaining a healthy lifestyle in general and staying active mentally and physically. I encouraged the patient to eat healthy, exercise daily and keep well hydrated, to keep a scheduled bedtime and wake time routine, to not skip any meals and eat healthy snacks in between meals and to have protein with every meal. I stressed the importance of regular exercise, within of course the patient's own mobility limitations. I encouraged the patient to keep up with current events  by reading the news paper or watching the news and to do word puzzles, or if feasible, to go on [email or web address].   As far as further diagnostic testing is concerned, I suggested the following: MRI brain without contrast, and  formal memory testing in the form of neuropsychological evaluation.  She is advised to reduce her caffeine intake and increase her water intake and improve her nutrition. She has not been eating well. In fact, her son believes that she may have not been eating during the day and wait for him for her evening meal. He is advised to look into help during the day for more supervision and also a call alert button for her to have in case she were to fall or have any other emergency. Her gait disorder is likely a combination of degenerative spine disease, and deconditioning. She has an appointment with you later this month and they are encouraged to talk with you about reinstituting physical therapy.   I answered all their questions today and the patient and her son were in agreement with the above outlined plan. I would like to see the patient back in 3 months, sooner if the need arises and encouraged them to call with any interim questions, concerns, problems, updates.   Thank you very much for allowing me to participate in the care of this nice patient. If I can be of any further assistance to you please do not hesitate to call me at  [phone].  Sincerely,   Shelby Foley, MD, PhD

## 2014-10-28 NOTE — Patient Instructions (Signed)
We will do further testing for your memory loss: MRI brain, formal neuropsychological evaluation.   I want to suggest a few things today:   Remember to drink plenty of fluid, eat healthy meals and do not skip any meals. Try to eat protein with a every meal and eat a healthy snack such as fruit or nuts in between meals. Try to keep a regular sleep-wake schedule. Good nutrition, proper sleep and exercise can help her cognitive function.  Engage in social activities in your community and with your family and try to keep up with current events by reading the newspaper or watching the news. If you have computer and can go online, try [email or web address]. Also, you may like to do word finding puzzles or crossword puzzles.  As far as your medications are concerned, I would like to suggest no new medications quite yet.   As far as diagnostic testing: see above.   I would like to see you back after the tests are completed, 3 months, sooner if we need to. Please call us with any interim questions, concerns, problems, updates or refill requests.

## 2014-11-15 ENCOUNTER — Ambulatory Visit
Admission: RE | Admit: 2014-11-15 | Discharge: 2014-11-15 | Disposition: A | Discharge status: Home / Self Care | Payer: Medicare Other | Source: Ambulatory Visit | Attending: Neurology | Admitting: Neurology

## 2014-11-15 DIAGNOSIS — R413 Other amnesia: Secondary | ICD-10-CM

## 2014-11-15 DIAGNOSIS — I739 Peripheral vascular disease, unspecified: Secondary | ICD-10-CM

## 2014-11-15 DIAGNOSIS — Z87891 Personal history of nicotine dependence: Secondary | ICD-10-CM

## 2014-11-18 ENCOUNTER — Telehealth: Payer: Self-pay | Admitting: Neurology

## 2014-11-18 NOTE — Telephone Encounter (Signed)
Faxed MRI results to Dr Fredrich BirksStern's office,received confirmation

## 2014-11-18 NOTE — Progress Notes (Signed)
Quick Note:  pls schedule 15 min FU sooner that scheduled to talk about MRI results and further w/u, thx sa ______

## 2014-11-18 NOTE — Telephone Encounter (Signed)
Called and scheduled appt.per  Dr Teofilo PodAthar's note, confirmed with son and he is requesting that results be sent to referring physician Maeola Harman(Joseph Stern)

## 2014-11-18 NOTE — Progress Notes (Signed)
Called and scheduled patient per Dr Frances FurbishAthar, confirmed with son

## 2014-11-28 ENCOUNTER — Ambulatory Visit (INDEPENDENT_AMBULATORY_CARE_PROVIDER_SITE_OTHER): Payer: Medicare Other | Admitting: Neurology

## 2014-11-28 ENCOUNTER — Encounter: Payer: Self-pay | Admitting: Neurology

## 2014-11-28 VITALS — BP 138/67 | HR 118 | Temp 98.0°F | Ht 64.0 in | Wt 116.0 lb

## 2014-11-28 DIAGNOSIS — IMO0002 Reserved for concepts with insufficient information to code with codable children: Secondary | ICD-10-CM

## 2014-11-28 DIAGNOSIS — R413 Other amnesia: Secondary | ICD-10-CM

## 2014-11-28 DIAGNOSIS — R9089 Other abnormal findings on diagnostic imaging of central nervous system: Secondary | ICD-10-CM

## 2014-11-28 DIAGNOSIS — E079 Disorder of thyroid, unspecified: Secondary | ICD-10-CM

## 2014-11-28 DIAGNOSIS — E1165 Type 2 diabetes mellitus with hyperglycemia: Secondary | ICD-10-CM

## 2014-11-28 DIAGNOSIS — R930 Abnormal findings on diagnostic imaging of skull and head, not elsewhere classified: Secondary | ICD-10-CM

## 2014-11-28 DIAGNOSIS — R269 Unspecified abnormalities of gait and mobility: Secondary | ICD-10-CM

## 2014-11-28 NOTE — Patient Instructions (Signed)
We will have Dr. Stern look at Shelby Perkins brain MRI We will await Dr. Maxwell MarionZelson's assessment.  It is important to get your blood sugar and your thyroid function under control.  I will see you back in 3 months.

## 2014-11-28 NOTE — Progress Notes (Signed)
Subjective:    Patient ID: Shelby Perkins is a 73 y.o. female.  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 She is accompanied by her son again. I first met her on 10/28/2014 at the request of her neurosurgeon, at which time the patient reported a several month history of memory loss. She also reported losing weight, not eating well, and endorsed not drinking enough water. At the time of her first visit I suggested further workup in the form of MRI brain and referred her for formal memory testing in the form of neuropsychological evaluation. Her MMSE was 19 at the time of her first visit and I talked to him about potentially starting a memory medication. I felt, she was most likely at risk for vascular dementia.  She has been scheduled with Dr. Valentina Shaggy for cognitive testing on 02/19/2015. 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 There are moderate changes of chronic microvascular ischemia. Overall no significant change compared with CT head dated 04/21/2012. In addition, personally reviewed the images through the PACS system.  Today, he reports that she is in the process to transition into a nursing home. She had blood work on 11/20/14, which I reviewed: pertinent findings include A1c of 11.5, glucose of 452, Na of 133, TSH of 0.081 and normal T4 and free T4. She had repeat blood work again today, with results pending. Her  son reports that she is relying more and more on her wheelchair. She has a 2 wheeled walker available. She has lost a little bit more weight. She had nausea and vomiting recently.  She lives with her son and has done so since 2009. Her son is away for work and rarely out of town. She has had PT, but finished it prematurely as I understand.    She denies VH/AH, delusions.   There is no FHx of dementia. She has not been driving since last year.   She drinks mountain Dew about 1 l per day and coffee.   He does not report any recent falls but she does endorse having fallen twice in the last month or so. She does not endorse having injured herself. She does not have any supervision during the day. They are looking into getting more help at the house. He is also looking into getting her a call alert button.    The patient denies prior TIA or stroke symptoms, such as sudden onset of one sided weakness, numbness, tingling, slurring of speech or droopy face, hearing loss, tinnitus, diplopia or visual field cut or monocular loss of vision, and denies recurrent headaches.  Of note, the patient denies snoring, and there is no report of witnessed apneas.   Her Past Medical History Is Significant For: Past Medical History  Diagnosis Date  . Hyperlipidemia   . Hypertension   . Peripheral arterial occlusive disease   . Diabetes mellitus without complication   . Osteoporosis   . Claudication   . Tobacco abuse     Her Past Surgical  History Is Significant For: Past Surgical History  Procedure Laterality Date  . Peripheral vascular angiogram  08/15/2003    Recommend fem-pop bypass  . Peripheral vascular angiogram  08/09/2000    Recommend fem-pop bypass  . Carotid doppler  07/28/2010    No bilateral ICA stenosis. Moderate Rt ECA stenosis.  . Cardiovascular stress test  05/12/2012    No significant wall motion abnormalities noted.    Her Family History Is Significant For: Family History  Problem Relation  Age of Onset  . Heart attack Mother     Her Social History Is Significant For: History   Social History  . Marital Status: Married    Spouse Name: N/A    Number of Children: 3  . Years of Education: N/A   Social History Main Topics  . Smoking status: Former Smoker    Types: Cigarettes  . Smokeless tobacco: Never Used  . Alcohol Use: No  . Drug Use: No  . Sexual Activity: None   Other Topics Concern  . None   Social History Narrative    Her Allergies Are:  No Known Allergies:   Her Current Medications Are:  Outpatient Encounter Prescriptions as of 11/28/2014  Medication Sig  . aspirin EC 81 MG tablet Take 81 mg by mouth daily.  . cilostazol (PLETAL) 100 MG tablet Take 100 mg by mouth 2 (two) times daily.  Marland Kitchen CINNAMON PO Take 1,000 mg by mouth daily.  Marland Kitchen glucose blood test strip 1 each by Other route as needed for other. Use as instructed  . Insulin Detemir (LEVEMIR FLEXTOUCH [place]) Inject 100 Units into the skin. Inject 20 units  daily  . Lancet Devices (RELION LANCING DEVICE) MISC by Does not apply route. As needed  . lisinopril (PRINIVIL,ZESTRIL) 20 MG tablet Take 20 mg by mouth daily.  Marland Kitchen lovastatin (MEVACOR) 40 MG tablet Take 40 mg by mouth at bedtime.  . metFORMIN (GLUCOPHAGE) 1000 MG tablet Take 1,000 mg by mouth 2 (two) times daily with a meal.  . omeprazole (PRILOSEC) 20 MG capsule Take 40 mg by mouth daily.  Marland Kitchen RELION ULTRA THIN LANCETS 30G MISC by Does not apply route. As needed  . traMADol (ULTRAM) 50 MG tablet Take 50 mg by mouth every 6 (six) hours as needed.  . vitamin B-12 (CYANOCOBALAMIN) 1000 MCG tablet Take 1,000 mcg by mouth daily.  . Vitamin D, Ergocalciferol, (DRISDOL) 50000 UNITS CAPS capsule Take 50,000 Units by mouth every 7 (seven) days.  . [DISCONTINUED] buPROPion (WELLBUTRIN SR) 150 MG 12 hr tablet   . [DISCONTINUED] fexofenadine (ALLEGRA) 180 MG tablet Take 180 mg by mouth daily.  . [DISCONTINUED] glyBURIDE-metformin (GLUCOVANCE) 5-500 MG per tablet  Take 2 tablets by mouth daily with breakfast.   . [DISCONTINUED] HYDROcodone-acetaminophen (NORCO/VICODIN) 5-325 MG per tablet Take 1 tablet by mouth at bedtime.  :  Review of Systems:  Out of a complete 14 point review of systems, all are reviewed and negative with the exception of these symptoms as listed below:   Review of Systems  Neurological:       Throwing up ,no strength to stand up alone    Objective:  Neurologic Exam  Physical Exam Physical Examination:   Filed Vitals:   11/28/14 1453  BP: 138/67  Pulse: 118  Temp: 98 F (36.7 C)    General Examination: The patient is a very pleasant 73 y.o. female in no acute distress. She is calm and cooperative with the exam. She denies Auditory Hallucinations and Visual  Hallucinations. She is adequately groomed and situated in a wheelchair. She appears frail and deconditioned.  HEENT: Normocephalic, atraumatic, pupils are equal, round and reactive to light and accommodation. Funduscopic exam is normal with sharp disc margins noted. Extraocular tracking shows mild saccadic breakdown without nystagmus noted. Hearing is impaired mildly. Face is symmetric with no significant facial masking and normal facial sensation. There is no lip, neck or jaw tremor. Neck is not rigid with intact passive ROM. There are no carotid bruits on auscultation. Oropharynx exam reveals moderate mouth dryness and mild pharyngeal erythema. No significant airway crowding is noted. Mallampati is class I. Tongue protrudes centrally and palate elevates symmetrically.    Chest: is clear to auscultation without wheezing, rhonchi or crackles noted.  Heart: sounds are regular and normal without murmurs, rubs or gallops noted.   Abdomen: is soft, non-tender and non-distended with normal bowel sounds appreciated on auscultation.  Extremities: There is no pitting edema in the distal lower extremities bilaterally. Pedal pulses are intact.   Skin: is warm and dry with no  trophic changes noted. Age-related changes are noted on the skin.   Musculoskeletal: exam reveals no obvious joint deformities, tenderness or joint swelling or erythema. 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   Neurologically:  Mental status: The patient is awake and alert, paying fair attention. She is able to partially provide the history. His son provides most of the history. Overall, she is less verbal today. Her memory, attention, language and knowledge are impaired. There is no aphasia, agnosia, apraxia or anomia. There is a mild degree of bradyphrenia. Speech is mildly hypophonic with no dysarthria noted. Mood is mildly depressed appearing and affect is blunted.  On 10/28/14: Her MMSE (Mini-Mental state exam) score is 19/30. CDT (Clock Drawing Test) score is 3/4. AFT (Animal Fluency Test) score is 7. Geriatric Depression Scale Score is 8.   Cranial nerves are as described above under HEENT exam. In addition, shoulder shrug is normal with equal shoulder height noted.  Motor exam:she appears deconditioned and frail. Bulk is thin throughout. She has a global strength of about 4+ out of 5. Reflexes are 2+ in the upper extremities trace in her knees and 1+ in the ankles with toes downgoing. She has no difficulty with fine motor skills or any evidence of parkinsonism. Romberg is not tested. I did not have her walk for me today as she did not bring her walker.  Assessment and Plan:   In summary, CAROL THEYS is a very pleasant 73 year old female with an underlying medical history of peripheral artery disease, hypertension, hyperlipidemia, prior longstanding history smoking (quit in 5/15), and previously diet-controlled diabetes, as well as low back pain deemed secondary to left S1 nerve root compression at L5-S1 level, status post steroid epidural injection recently, who has had issues with her memory for the past several months. She has memory loss. She had a brain MRI  which showed disproportionate widening of her lateral ventricles. I talked to the patient and her son in particular about her memory loss and her MRI findings. Normal pressure hydrocephalus cannot be fully excluded. Then again, contributing factors for her memory impairment may be her uncontrolled diabetes and thyroid dysfunction. She had an abnormal TSH which is being rechecked with repeat blood work today. She has an appointment with her neurosurgeon tomorrow. I would like for them to discuss her MRI findings with him as well. I would like to see if Dr. Vertell Limber has any  strong opinion about pursuing normal pressure hydrocephalus diagnosis and treatment in her case. On my end of things I believe that we need to make sure that her thyroid is rechecked and her blood sugars under control. She certainly has more risk factors for vascular dementia than for Alzheimer's dementia in my opinion. Good nutrition will be key and better hydration. She had borderline low sodium levels as well. Things to look out for are sudden changes in her mental status which can point towards electrolyte dysfunction, urinary tract infection, respiratory infection, blood sugar fluctuations and thyroid dysfunction. I explained this to her son in particular. She is in the process of transitioning to a nursing home. She has an appointment for cognitive evaluation in February and I would like to see her back in March.  I answered all their questions today and the patient and her son were in agreement with the above outlined plan. I would like to see the patient back in 3 months, sooner if the need arises and encouraged them to call with any interim questions, concerns, problems, updates.

## 2015-01-30 ENCOUNTER — Ambulatory Visit: Payer: Medicare Other | Admitting: Neurology

## 2015-02-19 ENCOUNTER — Ambulatory Visit: Payer: Medicare Other | Attending: Psychology | Admitting: Psychology

## 2015-02-19 DIAGNOSIS — R413 Other amnesia: Secondary | ICD-10-CM | POA: Diagnosis not present

## 2015-02-19 NOTE — Progress Notes (Addendum)
Initial Contact Note  Name: Shelby Perkins MRN: [id number] Date: 02/19/2015  Shelby Perkins is an 74 y.o. right handed female who was referred for neuropsychological evaluation by Huston Foley, MD due to problems with memory.   A total of 5 hours was spent today reviewing medical records, interviewing (CPT [phone]) Shelby Perkins and administering and scoring neurocognitive tests (CPT [id number]).  There were no concerns expressed or behaviors displayed by Shelby Perkins that would require immediate attention.   A full report will follow once the planned testing has been completed. Her next appointment is scheduled for 02/27/15.   Shelby Perkins, Ph.D Licensed Psychologist 02/19/2015     Shelby Perkins, Ph.D [facility]  [address]   Telephone [phone] [address] Fax [phone] [place], [place] [address]   NEUROPSYCHOLOGICAL EVALUATION  *CONFIDENTIAL* This report should not be released without the consent of the client  Name:   Shelby Perkins Date of Birth:  Oct 08, 1941 Shelby Perkins MR#:  [id number] Date of Evaluation: 02/19/15        Reason for Referral Shelby Perkins is a 74 year-old right-handed woman who was referred for neuropsychological evaluation by Huston Foley, MD of [facility]. Shelby Perkins has an approximate one year history of memory loss. A brain MRI scan on 11/18/14 showed enlarged ventricles with ventriculomegaly out of proportion to the mild degree of generalized atrophy as well as moderate changes of microvascular ischemia. Multiple etiologies for her cognitive decline have been considered, including normal pressure hydrocephalus, diabetes, thyroid disorder and vascular dysfunction.  Sources of Information Medical Records available from [facility] electronic medical records were reviewed. Shelby Perkins and her son, Shelby Perkins, were interviewed.  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 Her decision to retire was compelled by her report of persisting low back pain after a fall at work. At that time, he noticed that she was having difficulty with short-term memory, began to lose weight (she ultimately lost about thirty pounds) and was having episodes of bowel and bladder incontinence that he attributed to her slowness to physically make it to the bathroom. He reported that her short-term memory continued to gradually decline. In the fall of 2015, she demonstrated a dramatic decline in her daily functioning exemplified by her no longer preparing meals, taking her medications or paying her bills. He stated that she told him at the time that she "didn't feel like it". He has never observed her to exhibit confusion, problems with impulse control, affective lability, delusional thinking, poor judgment or unsafe behavior.  Declines in both her physical and cognitive functioning prompted her to move into a nursing home in December 2015. She currently receives assistance for some hygiene, dressing and toileting tasks due to her physical limitations. She has been relying on either a wheelchair or a walker due to episodes of imbalance. She has gained some weight back since her nursing home admission.   Shelby Perkins was not aware of the purpose of this visit. She was not aware of having memory problems but did report a tendency to occasionally stutter while speaking that began in December 2015. She reported occasional loss of balance though denied problems with eye-hand coordination, unilateral limb weakness, vision, hearing, speech, swallowing, appetite, taste or smell. She reported that she sleeps soundly  at night and has not had problems maintaining daytime wakefulness. With regards to mood, she did not describe  herself as depressed or unduly anxious though acknowledged that since she retired in 2013 she has lacked a sense of purpose in her life and has not found anything that interests her very much. She denied mood instability, suicidal/homicidal thoughts, hallucinations or delusional ideas.   Background Shelby Perkins was born in [place]. She immigrated to the [place] in 1981. She has been married and divorced twice. She has two sons and a daughter. She had been living with her son from 2009 until her move into a nursing home in late 2015.   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 She stated she decided to retire due to persisting low back pain following a work-related fall in 2013.   She stated that she attended school in her native [place] until about the age of 71. She dropped out to get a job so as to support her family. She denied history of school-based attentional or learning problems.  Her past medical history was notable for peripheral artery disease, hypertension, hyperlipidemia, low back pain since 2013 secondary to a fractured sacrum (L5 to S1) and diabetes.   Her current medications include aspirin, cilostzol, insulin, Lisinopril, lovastatin, metformin, omeprazole and tramadol.  She reported no history of head trauma, loss of consciousness, stroke-like symptom, seizures or substance abuse. She was a cigarette smoker for fifty years until she quit in May 2015.  She reported no family history of memory disorder, dementia or psychiatric disorder. She reported no history of emotional difficulties or mental health contacts.  Evaluation Procedures In addition to a review of medical records and clinical interviews, the following tests and questionnaires were administered: Animal Naming Test [facility] Naming Test Controlled Oral Word Association Test  Geriatric Depression Scale Rey Complex Figure- copy Trail Making A & B  Wechsler Adult  Intelligence Scale-IV: Clinical cytogeneticist, Coding, Digit Span, Matrix Reasoning & Similarities  Wechsler Memory Scale-IV: Older Adult Battery    Wide Range Achievement Test- 4  Assessment Results Observations & Interpretative Considerations: She appeared as an adequately groomed and dressed woman in no apparent physical distress. She was in a wheelchair. She appeared consistently alert and focused. She interacted in a pleasant though detached manner. Her affect appeared constricted in range. Other than transient, mild irritability, she did not display signs of emotional distress. No problems were evident for speech articulation, prosody, word finding, word selection, message coherence or language comprehension. Her thought processes were coherent and organized without signs of loose associations or verbal perseverations. Her thought content was devoid of unusual or bizarre ideas.  Test results were considered to represent an underestimate of her current cognitive functioning due to questionable motivation. At the outset, she expressed reluctance about undergoing this evaluation. Her attention to task appeared to fluctuate. She seemed to give up more easily as the testing session progressed. She also described herself as feeling tired, adding that she usually takes a nap at this time. She did not report or display problems with vision (she wore her eyeglasses), hearing or motor skills. She was able to comprehend task instructions. There were no signs of impulsive or perseverative responding.  Her pre-morbid level of general cognitive functioning was estimated to fall within the Low Average range based on her educational/vocational background coupled with a measure of word reading (Wide Range Achievement Test-4: Word Reading) that is considered  to be highly resistant to the effects of neurological or psychiatric disorder.   Her test scores were corrected to reflect norms for her age and, whenever possible, her  gender and educational level (i.e., 8 years). Please see the attached sheet for test scores.  Attention/Concentration:  Measures of visual processing speed were well within the subnormal range. Her speed to transcribe symbols to match numbers using a key (Wechsler Adult Intelligence Scale-IV (WAIS-IV) Coding) fell within the impaired range. Likewise, she performed within the impaired range on a test of visual processing speed on tests that required either simple numerical sequencing (Trails A) or set-shifting to maintain a complex sequence of numbers and letters (Trails B).    Her attentional capacity to temporarily hold and manipulate information within working memory was relatively well preserved based on her abilities to mentally re-arrange digit sequences in reverse or ascending order (WAIS-IV Digit Span) or immediately recognize symbols in left to right order (Wechsler Memory Scale-IV (WMS-IV) Symbol Span).  Measures of generative word fluency for both phonemic fluency (Controlled Oral Word Association Test) and semantic fluency (Animal Naming Test) were within the impaired range.     Learning & Memory: Her ability to learn and retain orally-presented information (WMS-IV Auditory Memory Index) fell within the Low Average range. Her immediate verbal recall of stories and words pairs varied from the Average to Low Average ranges, respectively. After an approximate thirty minute delay interval, her delayed verbal recall was as expected given her initial level of encoding. A recognition format (i.e., yes/no questions, multiple choices) aided her recall of stories but did not improve her delayed recall of word pairs. Her ability to learn and retain visual information (WMS-IV Visual Memory Index) was a relative weakness within the Mildly Impaired range. Her visual memory was most disrupted at the stage of acquisition. Her delayed recall of figural designs was as expected given her initial level of encoding,  which indicated intact visual memory storage. A recognition format did not help her visual delayed recall.  Language: Conversational speech and auditory comprehension were grossly intact. Her ability to name to confrontation [facility] Naming Test) fell within the Average range. Her phonemic fluency (Controlled Oral Word Association Test) was within the severely impaired range. Her word reading skill (Wide Range Achievement Test-4: Word Reading) was within the Low Average range.   Visual Spatial Abilities:  Visual spatial abilities were mixed. Her ability to assemble three-dimensional block designs from models Counsellor) was within the Average range. In contrast, her copy of a spatially complex figure (Rey Complex Figure) was impaired due to misplacements and distortions of design features, which seemed to reflect a combination of difficulties with perceptual-motor control and spatial planning.  Conceptual Reasoning: She demonstrated mildly subnormal performances on tests of abstract reasoning that required her verbally classify ostensibly different objects or ideas to a shared category (WAIS-IV Similarities) or apply logical nonverbal reasoning to match designs or symbols in an abstract manner (WAIS-IV Matrix Reasoning).  Emotional Functioning: On the Geriatric Depression Scale, her score of 2/15 did not indicate a likelihood of depression. The only items she endorsed were having dropped many of her interests and lacking energy.   Summary & Conclusions Neuropsychological evaluation of Shelby Perkins indicated deficits on tests that required speeded processing, visual learning and executive functions (i.e., mental flexibility, spatial planning and conceptual reasoning). On the other hand, her focused attention, working memory span, Youth worker, retention of Company secretary and naming to confrontation were within normal expectations.  In sum, she demonstrated cognitive impairment  in multiple domains, predominantly in the areas of processing speed and executive functioning. It is likely that her performance on the neuropsychological test battery reflected a mild underestimate of her cognitive functioning as she did not consistently seem motivated to do her best. It was noteworthy that her memory storage was intact. It is not clear that she has a dementia given that her adaptive functioning appears to have lately been disrupted by not only cognitive issues but also by physical limitations and perhaps by lack of motivation. While she firmly denied feeling depressed, her report that she has lacked a sense of purpose and has not found anything to interest her very much since her retirement in 2013 coupled with her sudden loss of drive in the fall of 6045 to perform some of her daily activities raises the possibility of a co-morbid depressive disorder.  Diagnostic Impressions Cognitive Decline [R41.89] Rule-Out Depressive Disorder  Recommendation 1. Given the suspicion of a depressive component to her decline in adaptive functioning, an empirical trial on an antidepressant medication should be considered, if medically appropriate.  A feedback session with Shelby Perkins and her son is scheduled for 02/27/15.   I have appreciated the opportunity to evaluate Ms. Steinborn. Please feel free to contact me with any comments or questions.      Shelby Perkins, Ph.D Licensed Psychologist    ADDENDUM- NEUROPSYCHOLOGICAL TEST RESULTS                      Name:   Shelby Perkins Date of Birth:   February 25, 2041 Shelby Perkins MR#:  [id number] Date of Evaluation: 02/19/15    Animal Naming Test Score= 8      1st (adjusted for age, gender and educational level)   Merchandiser, retail Score= 53/60     66th (adjusted for age, gender and educational level)   Controlled Oral Word Association Test 15 words/1 repetition     4th (adjusted for age, gender and educational level)   Rey Complex  Figure: copy   impaired due to misplacements and distortions of design features   Trails A 117s 0e       1st (adjusted for age, gender and educational level) Trails B  discontinued after 5 minutes  N/A   Wechsler Adult Intelligence Scale-IV (WAIS-IV)      scaled score   percentile  Block Design   8        25th     Similarities   5           5th     Digit Span   9        37th    Matrix Reasoning  6            9th   Coding    4          2nd       Wechsler Memory Scale-IV (WMS-IV) Older Adult Battery  Index                         Index Score    Percentile            Immediate Memory    80      9th                    Auditory Memory    87    19th  Visual Memory    76        5th                             Delayed Memory     83    13th    Symbol Span        16th      Wide Range Achievement Test-4  Subtest             raw score       standard score  %rank  Word Reading   50/70    87     19th

## 2015-02-27 ENCOUNTER — Encounter: Payer: Self-pay | Admitting: Neurology

## 2015-02-27 ENCOUNTER — Ambulatory Visit: Payer: Medicare Other | Attending: Psychology | Admitting: Psychology

## 2015-02-27 ENCOUNTER — Ambulatory Visit (INDEPENDENT_AMBULATORY_CARE_PROVIDER_SITE_OTHER): Payer: Medicare Other | Admitting: Neurology

## 2015-02-27 VITALS — BP 134/75 | HR 81 | Temp 97.1°F | Wt 114.0 lb

## 2015-02-27 DIAGNOSIS — R413 Other amnesia: Secondary | ICD-10-CM | POA: Diagnosis not present

## 2015-02-27 DIAGNOSIS — R9089 Other abnormal findings on diagnostic imaging of central nervous system: Secondary | ICD-10-CM

## 2015-02-27 DIAGNOSIS — E1165 Type 2 diabetes mellitus with hyperglycemia: Secondary | ICD-10-CM

## 2015-02-27 DIAGNOSIS — R112 Nausea with vomiting, unspecified: Secondary | ICD-10-CM

## 2015-02-27 DIAGNOSIS — IMO0002 Reserved for concepts with insufficient information to code with codable children: Secondary | ICD-10-CM

## 2015-02-27 DIAGNOSIS — I739 Peripheral vascular disease, unspecified: Secondary | ICD-10-CM

## 2015-02-27 DIAGNOSIS — R930 Abnormal findings on diagnostic imaging of skull and head, not elsewhere classified: Secondary | ICD-10-CM | POA: Diagnosis not present

## 2015-02-27 DIAGNOSIS — E079 Disorder of thyroid, unspecified: Secondary | ICD-10-CM

## 2015-02-27 NOTE — Progress Notes (Signed)
Subjective:    Patient ID: Shelby Perkins is a 74 y.o. female.  HPI     Interim history:   Ms. Shelby Perkins is a 74 year old right-handed woman with an underlying medical history of peripheral artery disease, hypertension, hyperlipidemia, prior longstanding history smoking (quit in 5/15), and diet-controlled diabetes, as well as low back pain deemed secondary to left S1 nerve root compression at L5-S1 level, status post steroid epidural injection recently, who presents for follow-up consultation of memory loss. She is accompanied by her son again today. I last saw her on 11/28/14, at which time her son reported, that she was in the process to transition into a nursing home. She had blood work on 11/20/14, which I reviewed: A1c of 11.5, glucose of 452, Na of 133, TSH of 0.081 and normal T4 and free T4. She had repeat blood work. She was relying more and more on her wheelchair. She has a 2 wheeled walker available. She had lost weight. She had nausea and vomiting recently.  In the interim, she had neuropsychological evaluation under Dr. Valentina Shaggy on 02/19/15 and a FU with him on 02/27/15. I reviewed his report: cognitive impairment in multiple domains, predominantly in the areas of processing speed and executive functioning. It is likely that her performance on the neuropsychological test battery reflected a mild underestimate of her cognitive functioning as she did not consistently seem motivated to do her best. It was noteworthy that her memory storage was intact. It is not clear that she has a dementia given that her adaptive functioning appears to have lately been disrupted by not only cognitive issues but also by physical limitations and perhaps by lack of motivation. While she firmly denied feeling depressed, her report that she has lacked a sense of purpose and has not found anything to interest her very much since her retirement in 2013 coupled with her sudden loss of drive in the fall of 2015 to  perform some of her daily activities raises the possibility of a co-morbid depressive disorder.  Today, she reports doing fairly. She has transitioned to a NH, [facility] in [place], [place]. She moved on 11/28/14. She has adjusted well. She still has intermittent nausea. She is walking about 2 times a week with the PT. She does drink enough water. She was recently evaluated by GI. She may need an upper GI endoscopy.  I first met her on 10/28/2014 at the request of her neurosurgeon, at which time the patient reported a several month history of memory loss. She also reported losing weight, not eating well, and endorsed not drinking enough water. At the time of her first visit I suggested further workup in the form of MRI brain and referred her for formal memory testing in the form of neuropsychological evaluation. Her MMSE was 19 at the time of her first visit and I talked to him about potentially starting a memory medication. I felt, she was most likely at risk for vascular dementia.  She has been scheduled with Dr. Valentina Shaggy for cognitive testing on 02/19/2015. 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 There are moderate changes of chronic microvascular ischemia. Overall no significant change compared with CT head dated 04/21/2012. In addition, personally reviewed the images through the PACS system.  She lives with her son and has done so since 2009. Her son is away for work and  rarely out of town. She has had PT, but finished it prematurely as I understand.  She denies VH/AH, delusions.  There is no FHx of dementia. She has not been driving since last year.  She drinks mountain Dew about 1 l per day and coffee.  He does not report any recent falls but she does endorse having fallen twice in the last month or so. She does not  endorse having injured herself. She does not have any supervision during the day. They are looking into getting more help at the house. He is also looking into getting her a call alert button.  The patient denies prior TIA or stroke symptoms, such as sudden onset of one sided weakness, numbness, tingling, slurring of speech or droopy face, hearing loss, tinnitus, diplopia or visual field cut or monocular loss of vision, and denies recurrent headaches.  Of note, the patient denies snoring, and there is no report of witnessed apneas.   Her Past Medical History Is Significant For: Past Medical History  Diagnosis Date  . Hyperlipidemia   . Hypertension   . Peripheral arterial occlusive disease   . Diabetes mellitus without complication   . Osteoporosis   . Claudication   . Tobacco abuse     Her Past Surgical History Is Significant For: Past Surgical History  Procedure Laterality Date  . Peripheral vascular angiogram  08/15/2003    Recommend fem-pop bypass  . Peripheral vascular angiogram  08/09/2000    Recommend fem-pop bypass  . Carotid doppler  07/28/2010    No bilateral ICA stenosis. Moderate Rt ECA stenosis.  . Cardiovascular stress test  05/12/2012    No significant wall motion abnormalities noted.    Her Family History Is Significant For: Family History  Problem Relation Age of Onset  . Heart attack Mother     Her Social History Is Significant For: History   Social History  . Marital Status: Married    Spouse Name: N/A  . Number of Children: 3  . Years of Education: N/A   Social History Main Topics  . Smoking status: Former Smoker    Types: Cigarettes  . Smokeless tobacco: Never Used  . Alcohol Use: No  . Drug Use: No  . Sexual Activity: Not on file   Other Topics Concern  . None   Social History Narrative    Her Allergies Are:  Not on File:   Her Current Medications Are:  Outpatient Encounter Prescriptions as of 02/27/2015  Medication Sig  . aspirin EC  81 MG tablet Take 81 mg by mouth daily.  . cilostazol (PLETAL) 100 MG tablet Take 100 mg by mouth 2 (two) times daily.  Marland Kitchen CINNAMON PO Take 1,000 mg by mouth daily.  Marland Kitchen glucose blood test strip 1 each by Other route as needed for other. Use as instructed  . Insulin Detemir (LEVEMIR FLEXTOUCH [place]) Inject 100 Units into the skin. Inject 20 units  daily  . Lancet Devices (RELION LANCING DEVICE) MISC by Does not apply route. As needed  . lisinopril (PRINIVIL,ZESTRIL) 20 MG tablet Take 20 mg by mouth daily.  Marland Kitchen lovastatin (MEVACOR) 40 MG tablet Take 40 mg by mouth at bedtime.  . metFORMIN (GLUCOPHAGE) 1000 MG tablet Take 1,000 mg by mouth 2 (two) times daily with a meal.  . omeprazole (PRILOSEC) 20 MG capsule Take 40 mg by mouth daily.  Marland Kitchen RELION ULTRA THIN LANCETS 30G MISC by Does not apply route. As needed  . traMADol (ULTRAM) 50 MG tablet  Take 50 mg by mouth every 6 (six) hours as needed.  . vitamin B-12 (CYANOCOBALAMIN) 1000 MCG tablet Take 1,000 mcg by mouth daily.  . Vitamin D, Ergocalciferol, (DRISDOL) 50000 UNITS CAPS capsule Take 50,000 Units by mouth every 7 (seven) days.  :  Review of Systems:  Out of a complete 14 point review of systems, all are reviewed and negative with the exception of these symptoms as listed below:   Review of Systems  Neurological:       Hungry but blood sugar may be a little low    Objective:  Neurologic Exam  Physical Exam Physical Examination:   Filed Vitals:   02/27/15 1309  BP: 134/75  Pulse: 81  Temp: 97.1 F (36.2 C)    General Examination: The patient is a very pleasant 74 y.o. female in no acute distress. She is calm and cooperative with the exam. She denies Auditory Hallucinations and Visual Hallucinations. She is adequately groomed and situated in a wheelchair. She appears frail and deconditioned.  HEENT: Normocephalic, atraumatic, pupils are equal, round and reactive to light and accommodation. Funduscopic exam is normal with sharp disc  margins noted. Extraocular tracking shows mild saccadic breakdown without nystagmus noted. Hearing is impaired mildly. Face is symmetric with no significant facial masking and normal facial sensation. There is no lip, neck or jaw tremor. Neck is not rigid with intact passive ROM. There are no carotid bruits on auscultation. Oropharynx exam reveals moderate mouth dryness and mild pharyngeal erythema. No significant airway crowding is noted. Mallampati is class I. Tongue protrudes centrally and palate elevates symmetrically.    Chest: is clear to auscultation without wheezing, rhonchi or crackles noted.  Heart: sounds are regular and normal without murmurs, rubs or gallops noted.   Abdomen: is soft, non-tender and non-distended with normal bowel sounds appreciated on auscultation.  Extremities: There is no pitting edema in the distal lower extremities bilaterally. Pedal pulses are intact.   Skin: is warm and dry with no trophic changes noted. Age-related changes are noted on the skin.   Musculoskeletal: exam reveals no obvious joint deformities, tenderness or joint swelling or erythema. 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   Neurologically:  Mental status: The patient is awake and alert, paying fair attention. She is able to partially provide the history. Her son provides most of the history. Overall, she is more verbal today. Her memory, attention, language and knowledge are impaired. There is no aphasia, agnosia, apraxia or anomia. There is a mild degree of bradyphrenia. Speech is mildly hypophonic with no dysarthria noted. Mood is mildly depressed appearing and affect is blunted.   On 10/28/14: Her MMSE (Mini-Mental state exam) score is 19/30. CDT (Clock Drawing Test) score is 3/4. AFT (Animal Fluency Test) score is 7. Geriatric Depression Scale Score is 8.   Cranial nerves are as described above under HEENT exam. In addition, shoulder shrug is normal with equal shoulder  height noted.  Motor exam:she appears deconditioned and frail. Bulk is thin throughout. She has a global strength of about 4+ out of 5. Reflexes are 2+ in the upper extremities trace in her knees and 1+ in the ankles with toes downgoing. She has no difficulty with fine motor skills or any evidence of parkinsonism. Romberg is not tested. I did not have her walk for me today as she did not bring her walker. Sensory exam is intact in the upper and lower extremities with no significant decrease in sensation in the  distal lower extremities.  Assessment and Plan:   In summary, CERINITY ZYNDA is a very pleasant 74 year old female with an underlying medical history of peripheral artery disease, hypertension, hyperlipidemia, prior longstanding history smoking (quit in 5/15), and previously diet-controlled diabetes, as well as low back pain deemed secondary to left S1 nerve root compression at L5-S1 level, status post steroid epidural injection, who has had issues with her memory for months and gait and balance. Normal pressure hydrocephalus cannot be fully excluded. Then again, contributing factors for her memory impairment may be her uncontrolled diabetes and thyroid dysfunction and untreated depression. She had an abnormal TSH which was then rechecked. She had an appointment with her neurosurgeon, Dr. Vertell Limber, who also talked to them about her brain scan. I would recommend that she talk to her PCP about depression and treatment for it. Her blood sugars are better, per son. She certainly has multiple risk factors for vascular dementia. Good nutrition is important, as well as good hydration. She had borderline low sodium levels as well. Things to look out for are sudden changes in her mental status which can point towards electrolyte dysfunction, urinary tract infection, respiratory infection, blood sugar fluctuations and thyroid dysfunction. I explained this to her son in particular. She has transitioned to a  nursing home. I would like to see her back in 3-4 months. We will hold off on adding a dementia medication at this time until she is on treatment for depression. I answered all their questions today and the patient and her son were in agreement. I encouraged them to call with any interim questions, concerns, problems, updates.

## 2015-02-27 NOTE — Progress Notes (Signed)
Name: Shelby Perkins MRN: [id number] Date: 02/27/2015  Results and recomemdations from her neuropsychological evaluation were reviewed with her and her son.   Gladstone PihMichael F. Jatniel Verastegui, Ph.D Licensed Psychologist 02/27/2015

## 2015-02-27 NOTE — Patient Instructions (Signed)
Please talk to your primary care physician about depression treatment.  I will see you back in about 3-4 months.  Try to walk with your walker and with assistance on a daily basis.  Drink more water.

## 2015-07-23 ENCOUNTER — Ambulatory Visit (INDEPENDENT_AMBULATORY_CARE_PROVIDER_SITE_OTHER): Payer: Medicare Other | Admitting: Neurology

## 2015-07-23 ENCOUNTER — Encounter: Payer: Self-pay | Admitting: Neurology

## 2015-07-23 VITALS — BP 122/58 | HR 72 | Resp 14

## 2015-07-23 DIAGNOSIS — F0390 Unspecified dementia without behavioral disturbance: Secondary | ICD-10-CM | POA: Diagnosis not present

## 2015-07-23 DIAGNOSIS — R413 Other amnesia: Secondary | ICD-10-CM | POA: Diagnosis not present

## 2015-07-23 DIAGNOSIS — G479 Sleep disorder, unspecified: Secondary | ICD-10-CM | POA: Insufficient documentation

## 2015-07-23 DIAGNOSIS — R930 Abnormal findings on diagnostic imaging of skull and head, not elsewhere classified: Secondary | ICD-10-CM | POA: Diagnosis not present

## 2015-07-23 DIAGNOSIS — R9089 Other abnormal findings on diagnostic imaging of central nervous system: Secondary | ICD-10-CM

## 2015-07-23 MED ORDER — MEMANTINE HCL ER 7 MG PO CP24: 7.0000 mg | ORAL_CAPSULE | Freq: Every day | ORAL | Status: DC

## 2015-07-23 NOTE — Progress Notes (Signed)
Subjective:    Shelby Perkins ID: Shelby Perkins is a 74 y.o. female.  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 Shelby Perkins is accompanied by Shelby Perkins son again today. I last saw Shelby Perkins on 02/27/2015, at which time we talked about Shelby Perkins blood work, Shelby Perkins imaging testing, and Shelby Perkins neuropsychological test results. These were in keeping with cognitive decline, rule out depressive disorder and given the suspicion of a depressive component to Shelby Perkins decline in adaptive functioning an empirical trial of an interpersonal was recommended by Dr. Valentina Shaggy. Shelby Perkins was advised to follow-up with Shelby Perkins primary care physician for what disorder management. I did not add any new medications at the time. We talked about Shelby Perkins risk factors for vascular dementia. Shelby Perkins MMSE was 19 out of 30 in 11/15.   Today, 07/23/2015: Shelby Perkins reports feeling sleepy. Most of Shelby Perkins history is provided by Shelby Perkins son. He reports that Shelby Perkins has been more sleepy during the day. Shelby Perkins spends most of Shelby Perkins day in bed. Sometimes Shelby Perkins does not change from nightclothes in today close. Shelby Perkins does not seem as motivated. Shelby Perkins has had a gradual decline in Shelby Perkins function. Shelby Perkins seems to be more deconditioned. There is lack of interest and less interaction. Shelby Perkins has been residing at [facility] since December 2015. Shelby Perkins has been on Celexa, which is currently 30 mg daily, generic. I reviewed Shelby Perkins MAR. Shelby Perkins is on baby aspirin, Colace, vitamin B12 by mouth thousand micrograms daily, vitamin D 50,000 units once a week, Humalog, Lantus, lisinopril, lovastatin, metformin. Shelby Perkins was started on Celexa 20 mg in March,  then increased to 30 mg a couple of months ago. While he does agree that Celexa has helped, he notices an overall decline in Shelby Perkins function.  Previously:   I saw Shelby Perkins on 11/28/14, at which time Shelby Perkins son reported, that Shelby Perkins was in the process to transition into a nursing home. Shelby Perkins had blood work on 11/20/14, which I reviewed: A1c of 11.5, glucose of 452, Na of 133, TSH of 0.081 and normal T4 and free T4. Shelby Perkins had repeat blood work. Shelby Perkins was relying more and more on Shelby Perkins wheelchair. Shelby Perkins has a 2 wheeled walker available. Shelby Perkins had lost weight. Shelby Perkins had nausea and vomiting recently.  In the interim, Shelby Perkins had neuropsychological evaluation under Dr. Valentina Shaggy on 02/19/15 and a FU with him on 02/27/15. I reviewed his report: cognitive impairment in multiple domains, predominantly in the areas of processing speed and executive functioning. It is likely that Shelby Perkins performance on the neuropsychological test battery reflected a mild underestimate of Shelby Perkins cognitive functioning as Shelby Perkins did not consistently seem motivated to do Shelby Perkins best. It was noteworthy that Shelby Perkins memory storage was intact. It is not clear that Shelby Perkins has a dementia given that Shelby Perkins adaptive functioning appears to have lately been disrupted by not only cognitive issues but also by physical limitations and perhaps by lack of motivation. While Shelby Perkins firmly denied feeling depressed, Shelby Perkins report that Shelby Perkins has lacked a sense of purpose and has not found anything to interest Shelby Perkins very much since Shelby Perkins retirement in 2013 coupled with Shelby Perkins sudden loss of drive in the fall of 2015 to perform some  of Shelby Perkins daily activities raises the possibility of a co-morbid depressive disorder.  I first met Shelby Perkins on 10/28/2014 at the request of Shelby Perkins neurosurgeon, at which time the Shelby Perkins reported a several month history of memory loss. Shelby Perkins also reported losing weight, not eating well, and endorsed not drinking enough water. At the time of Shelby Perkins first visit I suggested further workup in the form of MRI brain and referred  Shelby Perkins for formal memory testing in the form of neuropsychological evaluation. Shelby Perkins MMSE was 19 at the time of Shelby Perkins first visit and I talked to him about potentially starting a memory medication. I felt, Shelby Perkins was most likely at risk for vascular dementia.   Shelby Perkins has been scheduled with Dr. Valentina Shaggy for cognitive testing on 02/19/2015. Shelby Perkins had a brain MRI without contrast on 11/15/2014: Abnormal MRI scan of the brain showing enlarged ventricles with communicating hydrocephalus with ventriculomegaly out of proportion to the mild generalized atrophy seen. There are moderate changes of chronic microvascular ischemia. Overall no significant change compared with CT head dated 04/21/2012. In addition, personally reviewed the images through the PACS system.  Shelby Perkins lives with Shelby Perkins son and has done so since 2009. Shelby Perkins son is away for work and rarely out of town. Shelby Perkins has had PT, but finished it prematurely as I understand.    Shelby Perkins denies VH/AH, delusions.   There is no FHx of dementia. Shelby Perkins has not been driving since last year.   Shelby Perkins drinks mountain Dew about 1 l per day and coffee.   He does not report any recent falls but Shelby Perkins does endorse having fallen twice in the last month or so. Shelby Perkins does not endorse having injured herself. Shelby Perkins does not have any supervision during the day. They are looking into getting more help at the house. He is also looking into getting Shelby Perkins a call alert button.    The Shelby Perkins denies prior TIA or stroke symptoms, such as sudden onset of one sided weakness, numbness, tingling, slurring of speech or droopy face, hearing loss, tinnitus, diplopia or visual field cut or monocular loss of vision, and denies recurrent headaches.   Of note, the Shelby Perkins denies snoring, and there is no report of witnessed apneas.    Shelby Perkins Past Medical History Is Significant For: Past Medical History  Diagnosis Date  . Hyperlipidemia   . Hypertension   . Peripheral arterial occlusive disease   . Diabetes mellitus without  complication   . Osteoporosis   . Claudication   . Tobacco abuse     Shelby Perkins Past Surgical History Is Significant For: Past Surgical History  Procedure Laterality Date  . Peripheral vascular angiogram  08/15/2003    Recommend fem-pop bypass  . Peripheral vascular angiogram  08/09/2000    Recommend fem-pop bypass  . Carotid doppler  07/28/2010    No bilateral ICA stenosis. Moderate Rt ECA stenosis.  . Cardiovascular stress test  05/12/2012    No significant wall motion abnormalities noted.    Shelby Perkins Family History Is Significant For: Family History  Problem Relation Age of Onset  . Heart attack Mother     Shelby Perkins Social History Is Significant For: History   Social History  . Marital Status: Married    Spouse Name: N/A  . Number of Children: 3  . Years of Education: N/A   Social History Main Topics  . Smoking status: Former Smoker    Types: Cigarettes  . Smokeless tobacco: Never Used  . Alcohol Use: No  . Drug Use: No  .  Sexual Activity: Not on file   Other Topics Concern  . None   Social History Narrative    Shelby Perkins Allergies Are:  Not on File:   Shelby Perkins Current Medications Are:  Outpatient Encounter Prescriptions as of 07/23/2015  Medication Sig  . aspirin EC 81 MG tablet Take 81 mg by mouth daily.  . calcium carbonate (OS-CAL - DOSED IN MG OF ELEMENTAL CALCIUM) 1250 (500 CA) MG tablet Take 1 tablet by mouth.  . citalopram (CELEXA) 10 MG tablet 30 mg daily.   Marland Kitchen docusate sodium (COLACE) 100 MG capsule Take 100 mg by mouth 2 (two) times daily.  Marland Kitchen glucose blood test strip 1 each by Other route as needed for other. Use as instructed  . HUMALOG KWIKPEN 100 UNIT/ML KiwkPen   . HYDROcodone-acetaminophen (NORCO/VICODIN) 5-325 MG per tablet Take 1 tablet by mouth every 4 (four) hours as needed for moderate pain.  . Insulin Detemir (LEVEMIR FLEXTOUCH [place]) Inject 100 Units into the skin. Inject 20 units  daily  . Lancet Devices (RELION LANCING DEVICE) MISC by Does not apply route. As needed   . LANTUS SOLOSTAR 100 UNIT/ML Solostar Pen   . lisinopril (PRINIVIL,ZESTRIL) 20 MG tablet Take 20 mg by mouth daily.  Marland Kitchen lovastatin (MEVACOR) 40 MG tablet Take 40 mg by mouth at bedtime.  . metFORMIN (GLUCOPHAGE) 1000 MG tablet Take 1,000 mg by mouth 2 (two) times daily with a meal.  . pantoprazole (PROTONIX) 40 MG tablet   . promethazine (PHENERGAN) 25 MG tablet   . RELION ULTRA THIN LANCETS 30G MISC by Does not apply route. As needed  . traMADol (ULTRAM) 50 MG tablet Take 50 mg by mouth every 6 (six) hours as needed.  . vitamin B-12 (CYANOCOBALAMIN) 1000 MCG tablet Take 1,000 mcg by mouth daily.  . Vitamin D, Ergocalciferol, (DRISDOL) 50000 UNITS CAPS capsule Take 50,000 Units by mouth every 7 (seven) days.  . [DISCONTINUED] cilostazol (PLETAL) 100 MG tablet Take 100 mg by mouth 2 (two) times daily.  . [DISCONTINUED] CINNAMON PO Take 1,000 mg by mouth daily.  . [DISCONTINUED] omeprazole (PRILOSEC) 20 MG capsule Take 40 mg by mouth daily.   No facility-administered encounter medications on file as of 07/23/2015.  :  Review of Systems:  Out of a complete 14 point review of systems, all are reviewed and negative with the exception of these symptoms as listed below:   Review of Systems  Neurological:       Son reports decline in mood and motivation. Celexa started since last here. Reports increased weakness.  Difficulty with speech, shuddering.    Objective:  Neurologic Exam  Physical Exam Physical Examination:   Filed Vitals:   07/23/15 1141  BP: 122/58  Pulse: 72  Resp: 14    General Examination: The Shelby Perkins is a very pleasant 74 y.o. female in no acute distress. Shelby Perkins is calm and cooperative with the exam. Shelby Perkins denies Auditory Hallucinations and Visual Hallucinations. Shelby Perkins is adequately groomed and situated in a wheelchair. Shelby Perkins appears frail and deconditioned.  HEENT: Normocephalic, atraumatic, pupils are equal, round and reactive to light and accommodation. Funduscopic exam is  normal with sharp disc margins noted. Extraocular tracking shows mild saccadic breakdown without nystagmus noted. Hearing is impaired mildly. Face is symmetric with no significant facial masking and normal facial sensation. There is no lip, neck or jaw tremor. Neck is not rigid with intact passive ROM. There are no carotid bruits on auscultation. Oropharynx exam reveals moderate mouth dryness and mild pharyngeal erythema.  No significant airway crowding is noted. Shelby Perkins is edentulous. Shelby Perkins is in the process of getting dentures for Shelby Perkins lower jaw. Shelby Perkins had all Shelby Perkins lower teeth removed recently. Mallampati is class I. Tongue protrudes centrally and palate elevates symmetrically.   Chest: is clear to auscultation without wheezing, rhonchi or crackles noted.  Heart: sounds are regular and normal without murmurs, rubs or gallops noted.   Abdomen: is soft, non-tender and non-distended with normal bowel sounds appreciated on auscultation.  Extremities: There is no pitting edema in the distal lower extremities bilaterally. Pedal pulses are intact.   Skin: is warm and dry with no trophic changes noted. Age-related changes are noted on the skin.   Musculoskeletal: exam reveals no obvious joint deformities, tenderness or joint swelling or erythema. 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   Neurologically:  Mental status: The Shelby Perkins is awake and alert, paying fair attention. Shelby Perkins is able to partially provide the history. Shelby Perkins son provides most of the history. Overall, Shelby Perkins is appropriate in Shelby Perkins responses. Shelby Perkins memory, attention, language and knowledge are impaired. There is no aphasia, agnosia, apraxia or anomia. There is a mild degree of bradyphrenia. Speech is mildly hypophonic with no dysarthria noted. Mood is mildly depressed appearing and affect is slightly blunted.   On 10/28/14: Shelby Perkins MMSE (Mini-Mental state exam) score is 19/30. CDT (Clock Drawing Test) score is 3/4. AFT (Animal Fluency Test)  score is 7. Geriatric Depression Scale Score is 8.   On 07/23/2015: MMSE: 20/30, CDT: 1/4, AFT: 8/min, GDS: 9/15.   Cranial nerves are as described above under HEENT exam. In addition, shoulder shrug is normal with equal shoulder height noted.  Motor exam: Shelby Perkins appears deconditioned and frail. Bulk is thin throughout. Shelby Perkins has a global strength of about 4+ out of 5. Reflexes are 2+ in the upper extremities trace in Shelby Perkins knees and 1+ in the ankles with toes downgoing. Shelby Perkins has mild difficulty with fine motor skills or any evidence of parkinsonism. Romberg is not tested. I did not have Shelby Perkins walk for me today as Shelby Perkins did not bring Shelby Perkins walker. Sensory exam is intact in the upper and lower extremities with no significant decrease in sensation in the distal lower extremities.  Assessment and Plan:   In summary, BRIONY PARVEEN is a very pleasant 74 year old female with an underlying medical history of peripheral artery disease, hypertension, hyperlipidemia, prior longstanding history smoking (quit in 5/15), and previously diet-controlled diabetes, as well as low back pain deemed secondary to left S1 nerve root compression at L5-S1 level, status post steroid epidural injection, who presents for follow-up consultation of Shelby Perkins memory loss, associated with significant depression. Shelby Perkins has since Shelby Perkins last visit been started on Celexa generic. Shelby Perkins's currently at 30 mg daily. Shelby Perkins has residual issues with lack of motivation, and has had overall physical decline probably over the last few years. Shelby Perkins memory scores are generally speaking stable. Shelby Perkins had an abnormal brain MRI with vascular changes, global atrophy, with NPH not fully excluded.  confounding factors for memory impairment may be Shelby Perkins diabetes, thyroid disease, and residual depression. Shelby Perkins has multiple risk factors for vascular dementia. Good nutrition is important, as well as good hydration.  today I suggested we start Shelby Perkins on Namenda long-acting, 7 mg once daily. I  talked to them about expectations, benefit, potential side effects and the fact that we would gradually increase the dose to a maintenance dose. I filled out Shelby Perkins order sheet from Shelby Perkins nursing home. They  are encouraged to continue Shelby Perkins on Celexa 30 mg, with consideration of a higher dose such as 40 mg if needed. Shelby Perkins does not sleep very well at night. Shelby Perkins sleepy during the day. Shelby Perkins is encouraged to keep a scheduled for Shelby Perkins sleep and try to be more active physically and mentally. Shelby Perkins is encouraged to drink more water. I suggested for Shelby Perkins nighttime sleep that Shelby Perkins could try melatonin, 3-5 mg, 1-2 hours before Shelby Perkins that time. I would like to see Shelby Perkins back in 3 or 4 months, sooner if needed. I answered all Shelby Perkins questions today and the Shelby Perkins and Shelby Perkins son were in agreement.  I spent 25 minutes in total face-to-face time with the Shelby Perkins, more than 50% of which was spent in counseling and coordination of care, reviewing test results, reviewing medication and discussing or reviewing the diagnosis of dementia, its prognosis and treatment options.

## 2015-07-23 NOTE — Patient Instructions (Signed)
I would like to suggest starting you on a memory medication, Namenda XR, starting at 7 mg once daily with gradual build up down the road. Side effects include: nausea, confusion, hallucination, personality changes. If you are having mild side effects, try to stick with the treatment as these initial side effects may go away after the first 10-14 days.      You can try Melatonin at night for sleep: take 3 to 5 mg one to 2 hours before your bedtime.

## 2015-11-06 ENCOUNTER — Ambulatory Visit: Payer: Medicare Other | Admitting: Neurology

## 2015-11-26 ENCOUNTER — Encounter: Payer: Self-pay | Admitting: Neurology

## 2015-11-26 ENCOUNTER — Ambulatory Visit (INDEPENDENT_AMBULATORY_CARE_PROVIDER_SITE_OTHER): Payer: Medicare Other | Admitting: Neurology

## 2015-11-26 VITALS — BP 126/60 | HR 70 | Resp 14

## 2015-11-26 DIAGNOSIS — F0390 Unspecified dementia without behavioral disturbance: Secondary | ICD-10-CM | POA: Diagnosis not present

## 2015-11-26 DIAGNOSIS — R269 Unspecified abnormalities of gait and mobility: Secondary | ICD-10-CM

## 2015-11-26 DIAGNOSIS — R5381 Other malaise: Secondary | ICD-10-CM

## 2015-11-26 MED ORDER — MEMANTINE HCL ER 14 MG PO CP24: 14.0000 mg | ORAL_CAPSULE | Freq: Every day | ORAL | Status: DC

## 2015-11-26 NOTE — Patient Instructions (Addendum)
Your memory scores have been stable. I would like to increase your Namenda long-acting  to 14 mg daily. Furthermore, I would suggest that you increase melatonin to 5 mg, 1 hour before bedtime.  You will likely benefit from more PT with emphasis on lower body strengthening and gait training.

## 2015-11-26 NOTE — Progress Notes (Signed)
Subjective:    Patient ID: Shelby Perkins is a 74 y.o. female.  HPI     Interim history:   Ms. Shelby Perkins is a 74 year old right-handed woman with an underlying medical history of peripheral artery disease, hypertension, hyperlipidemia, prior longstanding history smoking (quit in 5/15), and diet-controlled diabetes, as well as low back pain deemed secondary to left S1 nerve root compression at L5-S1 level, status post steroid epidural injection recently, who presents for follow-up consultation of memory loss. She is accompanied by her son again today. I last saw her on 07/23/2015, at which time she reported feeling sleepy. Her son noted that she was more sleepy during the day. Her MMSE was 20/30, CDT: 1/4, AFT: 8/min, GDS: 9/15 at the time.  She spent most of her day in bed. Sometimes she would not even change from her night clothes. She had a gradual decline in her overall function. She appeared deconditioned and had less interest in things and less interaction with others. She has been at [facility] since December 2015. She was on Celexa 30 mg daily, generic, baby aspirin, Colace, vitamin B12 PO 1000 mcg daily, vitamin D 50,000 units once a week, Humalog, Lantus, lisinopril, lovastatin, metformin. She was started on Celexa 20 mg in March, then increased to 30 mg a couple of months later. I started her on Namenda long-acting 7 mg once daily. I suggested a possible increase in her antidepressant, Celexa to 40 mg daily. We talked about sleep hygiene and I also suggested that she take melatonin, 3-5 mg, 1-2 hours before her bedtime.  Today, 11/26/2015: She reports feeling fair. Her son provides essentially the entire history. He reports essentially no new problems but still no significant improvement in her depression but also no significant worsening. She is not motivated to do any physical exercise. She is getting some physical therapy but emphasis is primarily on upper body strengthening.  Appetite seems to be okay. She does not always drink enough water. She is in the process of getting new dentures and just received new eyeglasses which have helped. I reviewed the MAR. She has been on Celexa 40 mg daily. She continues to be on long-acting Namenda 7 mg daily. She is on melatonin 3 mg each night.  Previously:  I saw her on 02/27/2015, at which time we talked about her blood work, her imaging testing, and her neuropsychological test results. These were in keeping with cognitive decline, rule out depressive disorder and given the suspicion of a depressive component to her decline in adaptive functioning an empirical trial of an antidepressant was recommended by Dr. Valentina Shaggy. She was advised to follow-up with her primary care physician for mood disorder management. I did not add any new medications at the time. We talked about her risk factors for vascular dementia. Her MMSE was 19 out of 30 in 11/15.   I saw her on 11/28/14, at which time her son reported, that she was in the process to transition into a nursing home. She had blood work on 11/20/14, which I reviewed: A1c of 11.5, glucose of 452, Na of 133, TSH of 0.081 and normal T4 and free T4. She had repeat blood work. She was relying more and more on her wheelchair. She has a 2 wheeled walker available. She had lost weight. She had nausea and vomiting recently.  In the interim, she had neuropsychological evaluation under Dr. Valentina Shaggy on 02/19/15 and a FU with him on 02/27/15. I reviewed his report: cognitive impairment in  multiple domains, predominantly in the areas of processing speed and executive functioning. It is likely that her performance on the neuropsychological test battery reflected a mild underestimate of her cognitive functioning as she did not consistently seem motivated to do her best. It was noteworthy that her memory storage was intact. It is not clear that she has a dementia given that her adaptive functioning appears to have  lately been disrupted by not only cognitive issues but also by physical limitations and perhaps by lack of motivation. While she firmly denied feeling depressed, her report that she has lacked a sense of purpose and has not found anything to interest her very much since her retirement in 2013 coupled with her sudden loss of drive in the fall of 2015 to perform some of her daily activities raises the possibility of a co-morbid depressive disorder.  I first met her on 10/28/2014 at the request of her neurosurgeon, at which time the patient reported a several month history of memory loss. She also reported losing weight, not eating well, and endorsed not drinking enough water. At the time of her first visit I suggested further workup in the form of MRI brain and referred her for formal memory testing in the form of neuropsychological evaluation. Her MMSE was 19 at the time of her first visit and I talked to him about potentially starting a memory medication. I felt, she was most likely at risk for vascular dementia.   She has been scheduled with Dr. Valentina Shaggy for cognitive testing on 02/19/2015. 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 There are moderate changes of chronic microvascular ischemia. Overall no significant change compared with CT head dated 04/21/2012. In addition, personally reviewed the images through the PACS system.  She lives with her son and has done so since 2009. Her son is away for work and rarely out of town. She has had PT, but finished it prematurely as I understand.    She denies VH/AH, delusions.   There is no FHx of dementia. She has not been driving since last year.   She drinks mountain Dew about 1 l per day and coffee.   He does not report any recent falls but she does endorse having fallen twice in the  last month or so. She does not endorse having injured herself. She does not have any supervision during the day. They are looking into getting more help at the house. He is also looking into getting her a call alert button.    The patient denies prior TIA or stroke symptoms, such as sudden onset of one sided weakness, numbness, tingling, slurring of speech or droopy face, hearing loss, tinnitus, diplopia or visual field cut or monocular loss of vision, and denies recurrent headaches.   Of note, the patient denies snoring, and there is no report of witnessed apneas.    Her Past Medical History Is Significant For: Past Medical History  Diagnosis Date  . Hyperlipidemia   . Hypertension   . Peripheral arterial occlusive disease ([place])   . Diabetes mellitus without complication ([place])   . Osteoporosis   . Claudication ([place])   . Tobacco abuse     Her Past Surgical History Is Significant For: Past Surgical History  Procedure Laterality Date  . Peripheral vascular angiogram  08/15/2003    Recommend fem-pop bypass  . Peripheral  vascular angiogram  08/09/2000    Recommend fem-pop bypass  . Carotid doppler  07/28/2010    No bilateral ICA stenosis. Moderate Rt ECA stenosis.  . Cardiovascular stress test  05/12/2012    No significant wall motion abnormalities noted.    Her Family History Is Significant For: Family History  Problem Relation Age of Onset  . Heart attack Mother     Her Social History Is Significant For: Social History   Social History  . Marital Status: Married    Spouse Name: N/A  . Number of Children: 3  . Years of Education: N/A   Social History Main Topics  . Smoking status: Former Smoker    Types: Cigarettes  . Smokeless tobacco: Never Used  . Alcohol Use: No  . Drug Use: No  . Sexual Activity: Not Asked   Other Topics Concern  . None   Social History Narrative    Her Allergies Are:  No Known Allergies:   Her Current Medications Are:  Outpatient Encounter  Prescriptions as of 11/26/2015  Medication Sig  . aspirin EC 81 MG tablet Take 81 mg by mouth daily.  . calcium carbonate (OS-CAL - DOSED IN MG OF ELEMENTAL CALCIUM) 1250 (500 CA) MG tablet Take 1 tablet by mouth.  . ciprofloxacin (CIPRO) 500 MG tablet   . citalopram (CELEXA) 40 MG tablet   . docusate sodium (COLACE) 100 MG capsule Take 100 mg by mouth 2 (two) times daily.  Marland Kitchen HUMALOG KWIKPEN 100 UNIT/ML KiwkPen   . HYDROcodone-acetaminophen (NORCO/VICODIN) 5-325 MG per tablet Take 1 tablet by mouth every 4 (four) hours as needed for moderate pain.  Marland Kitchen LANTUS SOLOSTAR 100 UNIT/ML Solostar Pen   . lisinopril (PRINIVIL,ZESTRIL) 20 MG tablet Take 20 mg by mouth daily.  Marland Kitchen loratadine (CLARITIN) 10 MG tablet Take 10 mg by mouth daily.  Marland Kitchen lovastatin (MEVACOR) 40 MG tablet Take 40 mg by mouth at bedtime.  . Melatonin 3 MG CAPS Take by mouth.  . memantine (NAMENDA XR) 7 MG CP24 24 hr capsule Take 1 capsule (7 mg total) by mouth daily.  . metFORMIN (GLUCOPHAGE) 1000 MG tablet Take 1,000 mg by mouth 2 (two) times daily with a meal.  . ondansetron (ZOFRAN) 4 MG tablet Take 4 mg by mouth every 8 (eight) hours as needed for nausea or vomiting.  . pantoprazole (PROTONIX) 40 MG tablet   . promethazine (PHENERGAN) 25 MG tablet   . RELION ULTRA THIN LANCETS 30G MISC by Does not apply route. As needed  . senna (SENOKOT) 8.6 MG tablet Take 1 tablet by mouth daily.  . traMADol (ULTRAM) 50 MG tablet Take 50 mg by mouth every 6 (six) hours as needed.  . vitamin B-12 (CYANOCOBALAMIN) 1000 MCG tablet Take 1,000 mcg by mouth daily.  . Vitamin D, Ergocalciferol, (DRISDOL) 50000 UNITS CAPS capsule Take 50,000 Units by mouth every 7 (seven) days.  . [DISCONTINUED] citalopram (CELEXA) 10 MG tablet 30 mg daily.   . [DISCONTINUED] glucose blood test strip 1 each by Other route as needed for other. Use as instructed  . [DISCONTINUED] Insulin Detemir (LEVEMIR FLEXTOUCH ) Inject 100 Units into the skin. Inject 20 units   daily  . [DISCONTINUED] Lancet Devices (RELION LANCING DEVICE) MISC by Does not apply route. As needed   No facility-administered encounter medications on file as of 11/26/2015.  :  Review of Systems:  Out of a complete 14 point review of systems, all are reviewed and negative with the exception of these symptoms  as listed below:   Review of Systems  Neurological:       According to son patient is not doing as well as last visit. He states that the nursing at nursing home report that the namenda, celexa, and melatonin do not seem to help the patient.     Objective:  Neurologic Exam  Physical Exam Physical Examination:   Filed Vitals:   11/26/15 1037  BP: 126/60  Pulse: 70  Resp: 14   General Examination: The patient is a very pleasant 74 y.o. female in no acute distress. She is calm and cooperative with the exam. She is adequately groomed and situated in a wheelchair. She appears frail and deconditioned.  HEENT: Normocephalic, atraumatic, pupils are equal, round and reactive to light and accommodation. Funduscopic exam is normal with sharp disc margins noted. Extraocular tracking shows mild saccadic breakdown without nystagmus noted. Hearing is impaired mildly. Face is symmetric with no significant facial masking and normal facial sensation. There is no lip, neck or jaw tremor. Neck is not rigid with intact passive ROM. There are no carotid bruits on auscultation. Oropharynx exam reveals moderate mouth dryness and mild pharyngeal erythema. No significant airway crowding is noted. She is edentulous. She is in the process of getting dentures. She had all her lower teeth removed recently. Mallampati is class I. Tongue protrudes centrally and palate elevates symmetrically.   Chest: is clear to auscultation without wheezing, rhonchi or crackles noted.  Heart: sounds are regular and normal without murmurs, rubs or gallops noted.   Abdomen: is soft, non-tender and non-distended with normal  bowel sounds appreciated on auscultation.  Extremities: There is no pitting edema in the distal lower extremities bilaterally. Pedal pulses are intact.   Skin: is warm and dry with no trophic changes noted. Age-related changes are noted on the skin.   Musculoskeletal: exam reveals no obvious joint deformities, tenderness or joint swelling or erythema. 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   Neurologically:  Mental status: The patient is awake and alert, paying fair attention. She is able to partially provide the history. Her son provides most of the history. Overall, she is appropriate in her responses. Her memory, attention, language and knowledge are impaired. There is no aphasia, agnosia, apraxia or anomia. There is a mild degree of bradyphrenia. Speech is mildly hypophonic with no dysarthria noted. Mood is mildly depressed appearing and affect is slightly blunted.   On 10/28/14: Her MMSE (Mini-Mental state exam) score is 19/30. CDT (Clock Drawing Test) score is 3/4. AFT (Animal Fluency Test) score is 7. Geriatric Depression Scale Score is 8.   On 07/23/2015: MMSE: 20/30, CDT: 1/4, AFT: 8/min, GDS: 9/15.   On 11/26/2015: MMSE: 20/30, CDT: 3/4, AFT: 5/min.  Cranial nerves are as described above under HEENT exam. In addition, shoulder shrug is normal with equal shoulder height noted.  Motor exam: she appears deconditioned and frail. Bulk is thin throughout. She has a global strength of about 4- out of 5. Reflexes are 2+ in the upper extremities trace in her knees and 1+ in the ankles with toes downgoing. She has mild difficulty with fine motor skills or any evidence of parkinsonism. Romberg is not testable. She is unable to stand for me today. They did not bring a walker. Sensory exam is intact in the upper and lower extremities with no significant decrease in sensation in the distal lower extremities.  Assessment and Plan:   In summary, EMMYLOU BIEKER is a very  pleasant 74 year old female with an underlying medical history of peripheral artery disease, hypertension, hyperlipidemia, prior longstanding history smoking (quit in 5/15), and previously diet-controlled diabetes, as well as low back pain (left S1 nerve root compression at L5-S1 level, status post steroid epidural injection in the past), who presents for follow-up consultation of her memory loss, associated with significant depression. She has been on Celexa, which since her last visit has been increased to mg daily. She has residual issues with lack of motivation, and has had overall physical decline probably over the last few years. She looks more deconditioned today although she has not lost weight. I would like for her to have more physical therapy at her nursing home facility with emphasis to lower body strengthening and gait training. She was unable to stand for me today. Her memory scores have been stable. She is now on Namenda long-acting 7 mg once daily. I would like for this to be increased to 14 mg daily. She has been able to tolerate this. She has benefited from her new glasses. She is in the process of getting new dentures. She has in the recent past had an abnormal brain MRI with prominent vascular changes, global atrophy and NPH not fully excluded. Her depression has not become worse. Her sleep is not any better. She has been on melatonin 3 mg nightly. I would like for this to be increased to 5 mg each night, one hour before bedtime.  I discussed this with the patient and her son today. I provided written instructions and a new prescription and hard copy for the Namenda XR 14 MG daily. I would like to see her back in 3 or 4 months, sooner if needed. I answered all their questions today and the patient and her son were in agreement.  I spent 25 minutes in total face-to-face time with the patient, more than 50% of which was spent in counseling and coordination of care, reviewing test results,  reviewing medication and discussing or reviewing the diagnosis of dementia, its prognosis and treatment options.  I spent 25 minutes in total face-to-face time with the patient, more than 50% of which was spent in counseling and coordination of care, reviewing test results, reviewing medication and discussing or reviewing the diagnosis of dementia, its prognosis and treatment options.

## 2016-03-31 ENCOUNTER — Encounter: Payer: Self-pay | Admitting: Neurology

## 2016-03-31 ENCOUNTER — Ambulatory Visit (INDEPENDENT_AMBULATORY_CARE_PROVIDER_SITE_OTHER): Payer: Medicare Other | Admitting: Neurology

## 2016-03-31 VITALS — BP 132/72 | HR 78 | Resp 16

## 2016-03-31 DIAGNOSIS — F0390 Unspecified dementia without behavioral disturbance: Secondary | ICD-10-CM

## 2016-03-31 MED ORDER — MEMANTINE HCL ER 21 MG PO CP24: 21.0000 mg | ORAL_CAPSULE | Freq: Every day | ORAL | Status: DC

## 2016-03-31 NOTE — Progress Notes (Signed)
Subjective:    Patient ID: Shelby Perkins is a 75 y.o. female.  HPI     Interim history:   Shelby Perkins is a 75 year old right-handed woman with an underlying medical history of peripheral artery disease, hypertension, hyperlipidemia, prior longstanding history smoking (quit in 5/15), and diet-controlled diabetes, as well as low back pain deemed secondary to left S1 nerve root compression at L5-S1 level, status post steroid epidural injection recently, who presents for follow-up consultation of her dementia. She is accompanied by her son again today. I last saw her on 11/26/2015, which time she reported feeling fair. Her son provided almost her entire history. He reported no new problems but no significant improvement of her depression. He also noted that she was not as motivated to do any physical exercise. She was getting some physical therapy and emphasis was on upper body strengthening. Appetite was okay. She was not always drink enough water. She was getting new dentures and just received a new set of eyeglasses which helped. She was on Celexa generic 40 mg daily. She was on long-acting Namenda 7 mg once daily at the time. Her MMSE was 20/30, CDT: 3/4, AFT: 5/min, at the time. I suggested she increase her melatonin to 5 mg at night. I also suggested physical therapy with emphasis on lower body strengthening and gait training. I suggested we increase the long-acting Namenda to 14 mg once daily.  Today, 03/31/2016: She reports doing okay, has no new complaints. She has been able to tolerate the Namenda at 14 mg daily. Her son reports no telltale changes in her mood, maybe slightly better. She still is not very active physically. Her blood sugar values have been better. She was changed from generic Celexa to Effexor about a month and half ago or so. She is on Effexor 225 mg daily.  Previously:  I saw her on 07/23/2015, at which time she reported feeling sleepy. Her son noted that she was more  sleepy during the day. Her MMSE was 20/30, CDT: 1/4, AFT: 8/min, GDS: 9/15 at the time.   She spent most of her day in bed. Sometimes she would not even change from her night clothes. She had a gradual decline in her overall function. She appeared deconditioned and had less interest in things and less interaction with others. She has been at [facility] since December 2015. She was on Celexa 30 mg daily, generic, baby aspirin, Colace, vitamin B12 PO 1000 mcg daily, vitamin D 50,000 units once a week, Humalog, Lantus, lisinopril, lovastatin, metformin. She was started on Celexa 20 mg in March, then increased to 30 mg a couple of months later. I started her on Namenda long-acting 7 mg once daily. I suggested a possible increase in her antidepressant, Celexa to 40 mg daily. We talked about sleep hygiene and I also suggested that she take melatonin, 3-5 mg, 1-2 hours before her bedtime.  I saw her on 02/27/2015, at which time we talked about her blood work, her imaging testing, and her neuropsychological test results. These were in keeping with cognitive decline, rule out depressive disorder and given the suspicion of a depressive component to her decline in adaptive functioning an empirical trial of an antidepressant was recommended by Dr. Valentina Shaggy. She was advised to follow-up with her primary care physician for mood disorder management. I did not add any new medications at the time. We talked about her risk factors for vascular dementia. Her MMSE was 19 out of 30 in 11/15.  I saw her on 11/28/14, at which time her son reported, that she was in the process to transition into a nursing home. She had blood work on 11/20/14, which I reviewed: A1c of 11.5, glucose of 452, Na of 133, TSH of 0.081 and normal T4 and free T4. She had repeat blood work. She was relying more and more on her wheelchair. She has a 2 wheeled walker available. She had lost weight. She had nausea and vomiting recently.  In the interim,  she had neuropsychological evaluation under Dr. Valentina Shaggy on 02/19/15 and a FU with him on 02/27/15. I reviewed his report: cognitive impairment in multiple domains, predominantly in the areas of processing speed and executive functioning. It is likely that her performance on the neuropsychological test battery reflected a mild underestimate of her cognitive functioning as she did not consistently seem motivated to do her best. It was noteworthy that her memory storage was intact. It is not clear that she has a dementia given that her adaptive functioning appears to have lately been disrupted by not only cognitive issues but also by physical limitations and perhaps by lack of motivation. While she firmly denied feeling depressed, her report that she has lacked a sense of purpose and has not found anything to interest her very much since her retirement in 2013 coupled with her sudden loss of drive in the fall of 2015 to perform some of her daily activities raises the possibility of a co-morbid depressive disorder.  I first met her on 10/28/2014 at the request of her neurosurgeon, at which time the patient reported a several month history of memory loss. She also reported losing weight, not eating well, and endorsed not drinking enough water. At the time of her first visit I suggested further workup in the form of MRI brain and referred her for formal memory testing in the form of neuropsychological evaluation. Her MMSE was 19 at the time of her first visit and I talked to him about potentially starting a memory medication. I felt, she was most likely at risk for vascular dementia.   She has been scheduled with Dr. Valentina Shaggy for cognitive testing on 02/19/2015. 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 There are moderate changes of chronic  microvascular ischemia. Overall no significant change compared with CT head dated 04/21/2012. In addition, personally reviewed the images through the PACS system.  She lives with her son and has done so since 2009. Her son is away for work and rarely out of town. She has had PT, but finished it prematurely as I understand.    She denies VH/AH, delusions.   There is no FHx of dementia. She has not been driving since last year.   She drinks mountain Dew about 1 l per day and coffee.   He does not report any recent falls but she does endorse having fallen twice in the last month or so. She does not endorse having injured herself. She does not have any supervision during the day. They are looking into getting more help at the house. He is also looking into getting her a call alert button.    The patient denies prior TIA or stroke symptoms, such as sudden onset of one sided weakness, numbness, tingling, slurring of speech or droopy face, hearing loss, tinnitus, diplopia or visual field cut or monocular loss of  vision, and denies recurrent headaches.   Of note, the patient denies snoring, and there is no report of witnessed apneas.     Her Past Medical History Is Significant For: Past Medical History  Diagnosis Date  . Hyperlipidemia   . Hypertension   . Peripheral arterial occlusive disease ([place])   . Diabetes mellitus without complication ([place])   . Osteoporosis   . Claudication ([place])   . Tobacco abuse     Her Past Surgical History Is Significant For: Past Surgical History  Procedure Laterality Date  . Peripheral vascular angiogram  08/15/2003    Recommend fem-pop bypass  . Peripheral vascular angiogram  08/09/2000    Recommend fem-pop bypass  . Carotid doppler  07/28/2010    No bilateral ICA stenosis. Moderate Rt ECA stenosis.  . Cardiovascular stress test  05/12/2012    No significant wall motion abnormalities noted.    Her Family History Is Significant For: Family History  Problem  Relation Age of Onset  . Heart attack Mother     Her Social History Is Significant For: Social History   Social History  . Marital Status: Married    Spouse Name: N/A  . Number of Children: 3  . Years of Education: N/A   Social History Main Topics  . Smoking status: Former Smoker    Types: Cigarettes  . Smokeless tobacco: Never Used  . Alcohol Use: No  . Drug Use: No  . Sexual Activity: Not Asked   Other Topics Concern  . None   Social History Narrative    Her Allergies Are:  No Known Allergies:   Her Current Medications Are:  Outpatient Encounter Prescriptions as of 03/31/2016  Medication Sig  . aspirin EC 81 MG tablet Take 81 mg by mouth daily.  . calcium carbonate (OS-CAL - DOSED IN MG OF ELEMENTAL CALCIUM) 1250 (500 CA) MG tablet Take 1 tablet by mouth.  . ciprofloxacin (CIPRO) 500 MG tablet   . citalopram (CELEXA) 40 MG tablet   . docusate sodium (COLACE) 100 MG capsule Take 100 mg by mouth 2 (two) times daily.  Marland Kitchen HUMALOG KWIKPEN 100 UNIT/ML KiwkPen   . HYDROcodone-acetaminophen (NORCO/VICODIN) 5-325 MG per tablet Take 1 tablet by mouth every 4 (four) hours as needed for moderate pain.  Marland Kitchen LANTUS SOLOSTAR 100 UNIT/ML Solostar Pen   . lisinopril (PRINIVIL,ZESTRIL) 20 MG tablet Take 20 mg by mouth daily.  Marland Kitchen loratadine (CLARITIN) 10 MG tablet Take 10 mg by mouth daily.  Marland Kitchen lovastatin (MEVACOR) 40 MG tablet Take 40 mg by mouth at bedtime.  . Melatonin 3 MG CAPS Take by mouth.  . memantine (NAMENDA XR) 14 MG CP24 24 hr capsule Take 1 capsule (14 mg total) by mouth daily.  . memantine (NAMENDA XR) 7 MG CP24 24 hr capsule Take 1 capsule (7 mg total) by mouth daily.  . metFORMIN (GLUCOPHAGE) 1000 MG tablet Take 1,000 mg by mouth 2 (two) times daily with a meal.  . ondansetron (ZOFRAN) 4 MG tablet Take 4 mg by mouth every 8 (eight) hours as needed for nausea or vomiting.  . pantoprazole (PROTONIX) 40 MG tablet   . promethazine (PHENERGAN) 25 MG tablet   . RELION ULTRA THIN  LANCETS 30G MISC by Does not apply route. As needed  . senna (SENOKOT) 8.6 MG tablet Take 1 tablet by mouth daily.  . traMADol (ULTRAM) 50 MG tablet Take 50 mg by mouth every 6 (six) hours as needed.  . vitamin B-12 (CYANOCOBALAMIN) 1000 MCG tablet  Take 1,000 mcg by mouth daily.  . Vitamin D, Ergocalciferol, (DRISDOL) 50000 UNITS CAPS capsule Take 50,000 Units by mouth every 7 (seven) days.   No facility-administered encounter medications on file as of 03/31/2016.  :  Review of Systems:  Out of a complete 14 point review of systems, all are reviewed and negative with the exception of these symptoms as listed below:   Review of Systems  Neurological:       No new concerns per patient.     Objective:  Neurologic Exam  Physical Exam Physical Examination:   Filed Vitals:   03/31/16 1158  BP: 132/72  Pulse: 78  Resp: 16   General Examination: The patient is a very pleasant 75 y.o. female in no acute distress. She is calm and cooperative with the exam. She is adequately groomed and situated in a wheelchair. She apears less deconditioned today and more conversant today.  HEENT: Normocephalic, atraumatic, pupils are equal, round and reactive to light and accommodation. Extraocular tracking shows mild saccadic breakdown without nystagmus noted. Hearing is impaired mildly. Face is symmetric with no significant facial masking and normal facial sensation. There is no lip, neck or jaw tremor. Neck is not rigid with intact passive ROM. There are no carotid bruits on auscultation. Oropharynx exam reveals moderate mouth dryness and mild pharyngeal erythema. No significant airway crowding is noted. She is edentulous. She is in the process of getting dentures. She had all her lower teeth removed. Mallampati is class I. Tongue protrudes centrally and palate elevates symmetrically.    Chest: is clear to auscultation without wheezing, rhonchi or crackles noted.  Heart: sounds are regular and normal  without murmurs, rubs or gallops noted.   Abdomen: is soft, non-tender and non-distended with normal bowel sounds appreciated on auscultation.  Extremities: There is no pitting edema in the distal lower extremities bilaterally. Pedal pulses are intact.   Skin: is warm and dry with no trophic changes noted. Age-related changes are noted on the skin.   Musculoskeletal: exam reveals no obvious joint deformities, tenderness or joint swelling or erythema. 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   Neurologically:   Mental status: The patient is awake and alert, paying good attention. She is able to partially provide the history. Her son provides most of the history. Overall, she is appropriate in her responses, more alert and conversant today. Her memory, attention, language and knowledge are impaired. There is no aphasia, agnosia, apraxia or anomia. There is a mild degree of bradyphrenia. Speech is mildly hypophonic with no dysarthria noted. Mood is mildly depressed appearing and affect is slightly blunted.   On 10/28/14: Her MMSE (Mini-Mental state exam) score is 19/30. CDT (Clock Drawing Test) score is 3/4. AFT (Animal Fluency Test) score is 7. Geriatric Depression Scale Score is 8.   On 07/23/2015: MMSE: 20/30, CDT: 1/4, AFT: 8/min, GDS: 9/15.   On 11/26/2015: MMSE: 20/30, CDT: 3/4, AFT: 5/min.  On 03/31/2016: MMSE: 28/30, CDT: 3/4, AFT: 8/min.  Cranial nerves are as described above under HEENT exam. In addition, shoulder shrug is normal with equal shoulder height noted.  Motor exam: she appears deconditioned and frail. Bulk is thin throughout. She has a global strength of about 4- out of 5, weaker in the legs. Reflexes are 2+ in the upper extremities trace in her knees and 1+ in the ankles. She has mild difficulty with fine motor skills but no evidence of parkinsonism. Romberg is not testable. She is unable to  stand for me today. They did not bring a walker. Sensory exam is  intact in the upper and lower extremities with no significant decrease in sensation in the distal lower extremities.  Assessment and Plan:   In summary, MARKETIA STALLSMITH is a very pleasant 75 year old female with an underlying medical history of peripheral artery disease, hypertension, hyperlipidemia, prior longstanding history smoking (quit in 5/15), and previously diet-controlled diabetes, as well as low back pain (left S1 nerve root compression at L5-S1 level, status post steroid epidural injection in the past), who presents for follow-up consultation of her Dementia without behavioral problems but associated with depression. For this, she has been on generic Celexa, And this was recently changed about a month and a half ago to Effexor which is currently at 225 mg once daily. She appears improved today and that she is more cooperative, more alert, more conversant. Memory scores have improved some as well which is reassuring. I would like to keep her now on a maintenance dose of Namenda long-acting of 21 mg daily. I adjusted her prescription in that regard.  she looks less deconditioned today. I think she has been doing better. She has benefited from her new glasses. She is in the process of getting new dentures. She has in the recent past had an abnormal brain MRI with prominent vascular changes, global atrophy and NPH not fully excluded. Her depression has not become worse. Her sleep is  somewhat improved as well. I suggested a six-month follow-up, sooner if needed.   I spent 25 minutes in total face-to-face time with the patient, more than 50% of which was spent in counseling and coordination of care, reviewing test results, reviewing medication and discussing or reviewing the diagnosis of dementia, its prognosis and treatment options.

## 2016-03-31 NOTE — Patient Instructions (Addendum)
Please verify the Namenda XR dose.  I would like to increase it to 21 mg daily.  You look better today!

## 2016-09-29 ENCOUNTER — Ambulatory Visit (INDEPENDENT_AMBULATORY_CARE_PROVIDER_SITE_OTHER): Payer: Medicare Other | Admitting: Neurology

## 2016-09-29 ENCOUNTER — Encounter: Payer: Self-pay | Admitting: Neurology

## 2016-09-29 VITALS — BP 116/52 | HR 78 | Wt 147.0 lb

## 2016-09-29 DIAGNOSIS — F0390 Unspecified dementia without behavioral disturbance: Secondary | ICD-10-CM

## 2016-09-29 MED ORDER — DONEPEZIL HCL 5 MG PO TABS: 5.0000 mg | ORAL_TABLET | Freq: Every day | ORAL | 5 refills | Status: DC

## 2016-09-29 NOTE — Progress Notes (Signed)
Subjective:    Patient ID: Shelby Perkins is a 75 y.o. female.  HPI     Interim history:   Shelby Perkins is a 75 year old right-handed woman with an underlying medical history of peripheral artery disease, hypertension, hyperlipidemia, prior longstanding history smoking (quit in 5/15), and diet-controlled diabetes, as well as low back pain deemed secondary to left S1 nerve root compression at L5-S1 level, status post steroid epidural injection recently, who presents for follow-up consultation of Shelby dementia. She is accompanied by Shelby Perkins again today. I last saw Shelby on 03/31/2016, at which time she reported doing okay, she was able to tolerate Namenda long-acting at 14 mg. She is not very active physically. Sugar values were better. Shelby antidepressant was changed from Celexa to Effexor. She was on 225 mg daily. Shelby MMSE was 28/30, CDT: 3/4, AFT: 8/min at the time. I suggested we increase Shelby long-acting Namenda to 21 mg daily.  Today, 09/29/2016: She reports No new issues. Perkins reports that she is still not getting enough activity, she has gained weight. She does not like to exercise. She has been on Namenda long-acting 28 mg once daily according to Shelby MAR. day-to-day memory function fluctuates according to Shelby Perkins. Still has motivational issues.   Previously:   I saw Shelby on 11/26/2015, which time she reported feeling fair. Shelby Perkins provided almost Shelby entire history. He reported no new problems but no significant improvement of Shelby depression. He also noted that she was not as motivated to do any physical exercise. She was getting some physical therapy and emphasis was on upper body strengthening. Appetite was okay. She was not always drink enough water. She was getting new dentures and just received a new set of eyeglasses which helped. She was on Celexa generic 40 mg daily. She was on long-acting Namenda 7 mg once daily at the time. Shelby MMSE was 20/30, CDT: 3/4, AFT: 5/min, at the time. I  suggested she increase Shelby melatonin to 5 mg at night. I also suggested physical therapy with emphasis on lower body strengthening and gait training. I suggested we increase the long-acting Namenda to 14 mg once daily.    I saw Shelby on 07/23/2015, at which time she reported feeling sleepy. Shelby Perkins noted that she was more sleepy during the day. Shelby MMSE was 20/30, CDT: 1/4, AFT: 8/min, GDS: 9/15 at the time.   She spent most of Shelby day in bed. Sometimes she would not even change from Shelby night clothes. She had a gradual decline in Shelby overall function. She appeared deconditioned and had less interest in things and less interaction with others. She has been at [facility] since December 2015. She was on Celexa 30 mg daily, generic, baby aspirin, Colace, vitamin B12 PO 1000 mcg daily, vitamin D 50,000 units once a week, Humalog, Lantus, lisinopril, lovastatin, metformin. She was started on Celexa 20 mg in March, then increased to 30 mg a couple of months later. I started Shelby on Namenda long-acting 7 mg once daily. I suggested a possible increase in Shelby antidepressant, Celexa to 40 mg daily. We talked about sleep hygiene and I also suggested that she take melatonin, 3-5 mg, 1-2 hours before Shelby bedtime.   I saw Shelby on 02/27/2015, at which time we talked about Shelby blood work, Shelby imaging testing, and Shelby neuropsychological test results. These were in keeping with cognitive decline, rule out depressive disorder and given the suspicion of a depressive component to Shelby decline in  adaptive functioning an empirical trial of an antidepressant was recommended by Dr. Valentina Shaggy. She was advised to follow-up with Shelby primary care physician for mood disorder management. I did not add any new medications at the time. We talked about Shelby risk factors for vascular dementia. Shelby MMSE was 19 out of 30 in 11/15.    I saw Shelby on 11/28/14, at which time Shelby Perkins reported, that she was in the process to transition into a nursing  home. She had blood work on 11/20/14, which I reviewed: A1c of 11.5, glucose of 452, Na of 133, TSH of 0.081 and normal T4 and free T4. She had repeat blood work. She was relying more and more on Shelby wheelchair. She has a 2 wheeled walker available. She had lost weight. She had nausea and vomiting recently.   In the interim, she had neuropsychological evaluation under Dr. Valentina Shaggy on 02/19/15 and a FU with him on 02/27/15. I reviewed his report: cognitive impairment in multiple domains, predominantly in the areas of processing speed and executive functioning. It is likely that Shelby performance on the neuropsychological test battery reflected a mild underestimate of Shelby cognitive functioning as she did not consistently seem motivated to do Shelby best. It was noteworthy that Shelby memory storage was intact. It is not clear that she has a dementia given that Shelby adaptive functioning appears to have lately been disrupted by not only cognitive issues but also by physical limitations and perhaps by lack of motivation. While she firmly denied feeling depressed, Shelby report that she has lacked a sense of purpose and has not found anything to interest Shelby very much since Shelby retirement in 2013 coupled with Shelby sudden loss of drive in the fall of 2015 to perform some of Shelby daily activities raises the possibility of a co-morbid depressive disorder.   I first met Shelby on 10/28/2014 at the request of Shelby neurosurgeon, at which time the patient reported a several month history of memory loss. She also reported losing weight, not eating well, and endorsed not drinking enough water. At the time of Shelby first visit I suggested further workup in the form of MRI brain and referred Shelby for formal memory testing in the form of neuropsychological evaluation. Shelby MMSE was 19 at the time of Shelby first visit and I talked to him about potentially starting a memory medication. I felt, she was most likely at risk for vascular dementia.   She has been  scheduled with Dr. Valentina Shaggy for cognitive testing on 02/19/2015. 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 There are moderate changes of chronic microvascular ischemia. Overall no significant change compared with CT head dated 04/21/2012. In addition, personally reviewed the images through the PACS system.   She lives with Shelby Perkins and has done so since 2009. Shelby Perkins is away for work and rarely out of town. She has had PT, but finished it prematurely as I understand.   She denies VH/AH, delusions.   There is no FHx of dementia. She has not been driving since last year.   She drinks mountain Dew about 1 l per day and coffee.   He does not report any recent falls but she does endorse having fallen twice in the last month or so. She does not endorse having injured herself. She does not have any supervision during the day.  They are looking into getting more help at the house. He is also looking into getting Shelby a call alert button.   The patient denies prior TIA or stroke symptoms, such as sudden onset of one sided weakness, numbness, tingling, slurring of speech or droopy face, hearing loss, tinnitus, diplopia or visual field cut or monocular loss of vision, and denies recurrent headaches.   Of note, the patient denies snoring, and there is no report of witnessed apneas.      Shelby Past Medical History Is Significant For: Past Medical History:  Diagnosis Date  . Claudication ([place])   . Diabetes mellitus without complication ([place])   . Hyperlipidemia   . Hypertension   . Osteoporosis   . Peripheral arterial occlusive disease ([place])   . Tobacco abuse     Shelby Past Surgical History Is Significant For: Past Surgical History:  Procedure Laterality Date  . CARDIOVASCULAR STRESS TEST  05/12/2012   No significant wall motion  abnormalities noted.  Marland Kitchen CAROTID DOPPLER  07/28/2010   No bilateral ICA stenosis. Moderate Rt ECA stenosis.  Marland Kitchen PERIPHERAL VASCULAR ANGIOGRAM  08/15/2003   Recommend fem-pop bypass  . PERIPHERAL VASCULAR ANGIOGRAM  08/09/2000   Recommend fem-pop bypass    Shelby Family History Is Significant For: Family History  Problem Relation Age of Onset  . Heart attack Mother     Shelby Social History Is Significant For: Social History   Social History  . Marital status: Married    Spouse name: N/A  . Number of children: 3  . Years of education: N/A   Social History Main Topics  . Smoking status: Former Smoker    Types: Cigarettes  . Smokeless tobacco: Never Used  . Alcohol use No  . Drug use: No  . Sexual activity: Not Asked   Other Topics Concern  . None   Social History Narrative  . None    Shelby Allergies Are:  No Known Allergies:   Shelby Current Medications Are:  Outpatient Encounter Prescriptions as of 09/29/2016  Medication Sig  . calcium carbonate (OS-CAL) 600 MG TABS tablet Take 600 mg by mouth 2 (two) times daily with a meal.  . docusate sodium (COLACE) 100 MG capsule Take 100 mg by mouth 2 (two) times daily.  . Ferrous Fumarate (HEMOCYTE - 106 MG FE) 324 (106 Fe) MG TABS tablet Take 1 tablet by mouth.  . gabapentin (NEURONTIN) 300 MG capsule   . guaiFENesin 200 MG tablet Take 200 mg by mouth every 4 (four) hours as needed for cough or to loosen phlegm.  Marland Kitchen HUMALOG KWIKPEN 100 UNIT/ML KiwkPen   . HYDROcodone-acetaminophen (NORCO/VICODIN) 5-325 MG per tablet Take 1 tablet by mouth every 4 (four) hours as needed for moderate pain.  Marland Kitchen LANTUS SOLOSTAR 100 UNIT/ML Solostar Pen   . lisinopril (PRINIVIL,ZESTRIL) 20 MG tablet Take 20 mg by mouth daily.  Marland Kitchen loratadine (CLARITIN) 10 MG tablet Take 10 mg by mouth daily.  Marland Kitchen lovastatin (MEVACOR) 40 MG tablet Take 40 mg by mouth at bedtime.  . Melatonin 3 MG CAPS Take by mouth.  . memantine (NAMENDA XR) 28 MG CP24 24 hr capsule Take 28 mg by  mouth daily.  . Memantine HCl ER 21 MG CP24 Take 21 mg by mouth daily.  . metFORMIN (GLUCOPHAGE) 1000 MG tablet Take 1,000 mg by mouth 2 (two) times daily with a meal.  . ondansetron (ZOFRAN) 4 MG tablet Take 4 mg by mouth every 8 (eight) hours as needed for nausea or  vomiting.  . pantoprazole (PROTONIX) 40 MG tablet   . polyethylene glycol powder (GLYCOLAX/MIRALAX) powder   . senna (SENOKOT) 8.6 MG tablet Take 1 tablet by mouth daily.  . traMADol (ULTRAM) 50 MG tablet Take 50 mg by mouth every 6 (six) hours as needed.  . traZODone (DESYREL) 50 MG tablet Take 50 mg by mouth at bedtime.  . Venlafaxine HCl 225 MG TB24   . vitamin B-12 (CYANOCOBALAMIN) 1000 MCG tablet Take 1,000 mcg by mouth daily.  . Vitamin D, Ergocalciferol, (DRISDOL) 50000 UNITS CAPS capsule Take 50,000 Units by mouth every 7 (seven) days.   No facility-administered encounter medications on file as of 09/29/2016.   :  Review of Systems:  Out of a complete 14 point review of systems, all are reviewed and negative with the exception of these symptoms as listed below: Review of Systems  Neurological:       Patient is here for f/u. Per patient and Perkins, no new concerns.     Objective:  Neurologic Exam  Physical Exam Physical Examination:   Vitals:   09/29/16 1416  BP: (!) 116/52  Pulse: 78   General Examination: The patient is a very pleasant 75 y.o. female in no acute distress. She is calm and cooperative with the exam. She is adequately groomed and situated in a wheelchair. She apears mildly deconditioned today, has gained weight.   HEENT: Normocephalic, atraumatic, pupils are equal, round and reactive to light and accommodation. Extraocular tracking shows mild saccadic breakdown without nystagmus noted. Hearing is impaired mildly. Face is symmetric with no significant facial masking and normal facial sensation. There is no lip, neck or jaw tremor. Neck is not rigid with intact passive ROM. There are no carotid  bruits on auscultation. Oropharynx exam reveals moderate mouth dryness and mild pharyngeal erythema. No significant airway crowding is noted. She is edentulous. No glasses. Mallampati is class I. Tongue protrudes centrally and palate elevates symmetrically.    Chest: is clear to auscultation without wheezing, rhonchi or crackles noted.  Heart: sounds are regular and normal without murmurs, rubs or gallops noted.   Abdomen: is soft, non-tender and non-distended with normal bowel sounds appreciated on auscultation.  Extremities: There is no pitting edema in the distal lower extremities bilaterally. Pedal pulses are intact.   Skin: is warm and dry with no trophic changes noted. Age-related changes are noted on the skin. Dry skin in extremities and patchy dry skin areas, eczematous changes in the face.  Musculoskeletal: exam reveals no obvious joint deformities, tenderness or joint swelling or erythema. 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   Neurologically:   Mental status: The patient is awake and alert, paying good attention. She is able to partially provide the history. Shelby Perkins provides most of the history. Overall, she is appropriate in Shelby responses, alert and conversant today. Shelby memory, attention, language and knowledge are impaired. There is no aphasia, agnosia, apraxia or anomia. There is a mild degree of bradyphrenia. Speech is mildly hypophonic with no dysarthria noted. Mood is mildly depressed appearing and affect is slightly blunted.   On 10/28/14: Shelby MMSE (Mini-Mental state exam) score is 19/30. CDT (Clock Drawing Test) score is 3/4. AFT (Animal Fluency Test) score is 7. Geriatric Depression Scale Score is 8.   On 07/23/2015: MMSE: 20/30, CDT: 1/4, AFT: 8/min, GDS: 9/15.   On 11/26/2015: MMSE: 20/30, CDT: 3/4, AFT: 5/min.  On 03/31/2016: MMSE: 28/30, CDT: 3/4, AFT: 8/min.  On 09/29/2016: MMSE: 21/30,  CDT: 2/4, AFT: 5/min.  Cranial nerves are as described  above under HEENT exam. In addition, shoulder shrug is normal with equal shoulder height noted.  Motor exam: she appears deconditioned and frail. Bulk is thin throughout. She has a global strength of about 4- out of 5, weaker in the legs. Reflexes are 2+ in the upper extremities trace in Shelby knees and 1+ in the ankles. She has mild difficulty with fine motor skills but no evidence of parkinsonism. Romberg is not testable. She is unable to stand for me today. They did not bring a walker. Sensory exam is intact in the upper and lower extremities with no significant decrease in sensation in the distal lower extremities.  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 For this, she has been on generic Celexa, but this was changed to Effexor, maintained at 225 mg once daily. She had neuropsychological evaluation under Dr. Valentina Shaggy on 02/19/15 and a FU with him on 02/27/15: cognitive impairment in multiple domains, predominantly in the areas of processing speed and executive functioning. She has had some decline in Shelby memory scores, has gained weight. I suggested we continue with Namenda XR 28  mg daily. I also suggested she start Aricept 5 mg daily. She Had benefited from Shelby new glasses but is not using them now. She does not use Shelby dentures. A brain MRI in the past showed prominent vascular changes, global atrophy and NPH not fully excluded. Shelby depression has not become worse. Shelby sleep is not as good. I suggested that they monitor Shelby weight. She  has gained a significant amount of weight in the past 18 months. Most likely due to a combination of lack of exercise and increase in caloric intake. I suggested a four-month follow-up, sooner as needed. I answered all their questions today and the patient and Shelby Perkins were in agreement. I spent 25 minutes in total face-to-face time with the patient, more than 50% of which was spent in counseling and coordination of care, reviewing test results, reviewing medication and discussing or reviewing the diagnosis of dementia, its prognosis and treatment options.

## 2016-09-29 NOTE — Patient Instructions (Signed)
We will continue with Namenda XR 28 mg daily.   We will start Aricept (generic name: donepezil) 5 mg: take one pill each evening. Common side effects may include dry eyes, dry mouth, confusion, low pulse, low blood pressure, also GI related side effects (nausea, vomiting, diarrhea, constipation), headaches; rare side effects may include hallucinations and seizures.

## 2017-02-02 ENCOUNTER — Ambulatory Visit (INDEPENDENT_AMBULATORY_CARE_PROVIDER_SITE_OTHER): Payer: Medicare Other | Admitting: Neurology

## 2017-02-02 ENCOUNTER — Encounter: Payer: Self-pay | Admitting: Neurology

## 2017-02-02 VITALS — BP 132/68 | HR 96

## 2017-02-02 DIAGNOSIS — R5381 Other malaise: Secondary | ICD-10-CM

## 2017-02-02 DIAGNOSIS — F0150 Vascular dementia without behavioral disturbance: Secondary | ICD-10-CM

## 2017-02-02 DIAGNOSIS — Z9189 Other specified personal risk factors, not elsewhere classified: Secondary | ICD-10-CM

## 2017-02-02 MED ORDER — DONEPEZIL HCL 10 MG PO TABS: 10.0000 mg | ORAL_TABLET | Freq: Every day | ORAL | 11 refills | Status: AC

## 2017-02-02 NOTE — Patient Instructions (Signed)
We will continue with you Namenda XR 28 mg daily.  We will increase your Aricept to 10 mg once daily.  We will see you for follow up in 6 months.

## 2017-02-02 NOTE — Progress Notes (Signed)
Subjective:    Patient ID: Shelby Perkins is a 76 y.o. female.  HPI     Interim history:   Shelby Perkins is a 76 year old right-handed woman with an underlying medical history of peripheral artery disease, hypertension, hyperlipidemia, prior longstanding history smoking (quit in 5/15), and diet-controlled diabetes, as well as low back pain deemed secondary to left S1 nerve root compression at L5-S1 level, status post steroid epidural injection recently, who presents for follow-up consultation of her dementia. She is accompanied by her son again today. I last saw her on 09/29/2016 at which time her son reported that she was not exercising very much and had ongoing issues with motivation. She was on long-acting Namenda. Her MMSE was 21 out of 30, clock drawing was 2 out of 4 and animal fluency was 5/m at the time. I suggested we add low-dose donepezil.  Today, 02/02/2017 (all dictated new, as well as above notes, some dictation done in note pad or Word, outside of chart, may appear as copied): She reports that her memory is "not good". She complains of low back pain. Her son reports that she has had some skin breakdown from laying on one side. She does not have an option to sit in her room, there is no seating option in her room. She usually sits in bed halfway reclined or in her wheelchair. She has not fallen. Lack of motivation to participate in any activity is still in issue. She has the opportunity to read magazines which her son brings and but does not typically read very much. Appetite is okay, weight has steadily increased after her nadir in 2015 of around 110 pounds. Seems to tolerate the low-dose donepezil of K, I reviewed the [facility] and records from her facility. I filled out their form as well.  The patient's allergies, current medications, family history, past medical history, past social history, past surgical history and problem list were reviewed and updated as appropriate.     Previously (copied from previous notes for reference):   I saw her on 03/31/2016, at which time she reported doing okay, she was able to tolerate Namenda long-acting at 14 mg. She is not very active physically. Sugar values were better. Her antidepressant was changed from Celexa to Effexor. She was on 225 mg daily. Her MMSE was 28/30, CDT: 3/4, AFT: 8/min at the time. I suggested we increase her long-acting Namenda to 21 mg daily.   I saw her on 11/26/2015, which time she reported feeling fair. Her son provided almost her entire history. He reported no new problems but no significant improvement of her depression. He also noted that she was not as motivated to do any physical exercise. She was getting some physical therapy and emphasis was on upper body strengthening. Appetite was okay. She was not always drink enough water. She was getting new dentures and just received a new set of eyeglasses which helped. She was on Celexa generic 40 mg daily. She was on long-acting Namenda 7 mg once daily at the time. Her MMSE was 20/30, CDT: 3/4, AFT: 5/min, at the time. I suggested she increase her melatonin to 5 mg at night. I also suggested physical therapy with emphasis on lower body strengthening and gait training. I suggested we increase the long-acting Namenda to 14 mg once daily.   I saw her on 07/23/2015, at which time she reported feeling sleepy. Her son noted that she was more sleepy during the day. Her MMSE was 20/30, CDT: 1/4, AFT:  8/min, GDS: 9/15 at the time.   She spent most of her day in bed. Sometimes she would not even change from her night clothes. She had a gradual decline in her overall function. She appeared deconditioned and had less interest in things and less interaction with others. She has been at [facility] since December 2015. She was on Celexa 30 mg daily, generic, baby aspirin, Colace, vitamin B12 PO 1000 mcg daily, vitamin D 50,000 units once a week, Humalog, Lantus,  lisinopril, lovastatin, metformin. She was started on Celexa 20 mg in March, then increased to 30 mg a couple of months later. I started her on Namenda long-acting 7 mg once daily. I suggested a possible increase in her antidepressant, Celexa to 40 mg daily. We talked about sleep hygiene and I also suggested that she take melatonin, 3-5 mg, 1-2 hours before her bedtime.   I saw her on 02/27/2015, at which time we talked about her blood work, her imaging testing, and her neuropsychological test results. These were in keeping with cognitive decline, rule out depressive disorder and given the suspicion of a depressive component to her decline in adaptive functioning an empirical trial of an antidepressant was recommended by Dr. Leonides Cave. She was advised to follow-up with her primary care physician for mood disorder management. I did not add any new medications at the time. We talked about her risk factors for vascular dementia. Her MMSE was 19 out of 30 in 11/15.    I saw her on 11/28/14, at which time her son reported, that she was in the process to transition into a nursing home. She had blood work on 11/20/14, which I reviewed: A1c of 11.5, glucose of 452, Na of 133, TSH of 0.081 and normal T4 and free T4. She had repeat blood work. She was relying more and more on her wheelchair. She has a 2 wheeled walker available. She had lost weight. She had nausea and vomiting recently.   In the interim, she had neuropsychological evaluation under Dr. Leonides Cave on 02/19/15 and a FU with him on 02/27/15. I reviewed his report: cognitive impairment in multiple domains, predominantly in the areas of processing speed and executive functioning. It is likely that her performance on the neuropsychological test battery reflected a mild underestimate of her cognitive functioning as she did not consistently seem motivated to do her best. It was noteworthy that her memory storage was intact. It is not clear that she has a dementia given  that her adaptive functioning appears to have lately been disrupted by not only cognitive issues but also by physical limitations and perhaps by lack of motivation. While she firmly denied feeling depressed, her report that she has lacked a sense of purpose and has not found anything to interest her very much since her retirement in 2013 coupled with her sudden loss of drive in the fall of 6943 to perform some of her daily activities raises the possibility of a co-morbid depressive disorder.   I first met her on 10/28/2014 at the request of her neurosurgeon, at which time the patient reported a several month history of memory loss. She also reported losing weight, not eating well, and endorsed not drinking enough water. At the time of her first visit I suggested further workup in the form of MRI brain and referred her for formal memory testing in the form of neuropsychological evaluation. Her MMSE was 19 at the time of her first visit and I talked to him about potentially  starting a memory medication. I felt, she was most likely at risk for vascular dementia.   She has been scheduled with Dr. Valentina Shaggy for cognitive testing on 02/19/2015. 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 There are moderate changes of chronic microvascular ischemia. Overall no significant change compared with CT head dated 04/21/2012. In addition, personally reviewed the images through the PACS system.   She lives with her son and has done so since 2009. Her son is away for work and rarely out of town. She has had PT, but finished it prematurely as I understand.   She denies VH/AH, delusions.   There is no FHx of dementia. She has not been driving since last year.   She drinks mountain Dew about 1 l per day and coffee.   He does not report any recent falls  but she does endorse having fallen twice in the last month or so. She does not endorse having injured herself. She does not have any supervision during the day. They are looking into getting more help at the house. He is also looking into getting her a call alert button.   The patient denies prior TIA or stroke symptoms, such as sudden onset of one sided weakness, numbness, tingling, slurring of speech or droopy face, hearing loss, tinnitus, diplopia or visual field cut or monocular loss of vision, and denies recurrent headaches.   Of note, the patient denies snoring, and there is no report of witnessed apneas.     Her Past Medical History Is Significant For: Past Medical History:  Diagnosis Date  . Claudication ([place])   . Diabetes mellitus without complication ([place])   . Hyperlipidemia   . Hypertension   . Osteoporosis   . Peripheral arterial occlusive disease ([place])   . Tobacco abuse     Her Past Surgical History Is Significant For: Past Surgical History:  Procedure Laterality Date  . CARDIOVASCULAR STRESS TEST  05/12/2012   No significant wall motion abnormalities noted.  Marland Kitchen CAROTID DOPPLER  07/28/2010   No bilateral ICA stenosis. Moderate Rt ECA stenosis.  Marland Kitchen PERIPHERAL VASCULAR ANGIOGRAM  08/15/2003   Recommend fem-pop bypass  . PERIPHERAL VASCULAR ANGIOGRAM  08/09/2000   Recommend fem-pop bypass    Her Family History Is Significant For: Family History  Problem Relation Age of Onset  . Heart attack Mother     Her Social History Is Significant For: Social History   Social History  . Marital status: Married    Spouse name: N/A  . Number of children: 3  . Years of education: N/A   Social History Main Topics  . Smoking status: Former Smoker    Types: Cigarettes  . Smokeless tobacco: Never Used  . Alcohol use No  . Drug use: No  . Sexual activity: Not Asked   Other Topics Concern  . None   Social History Narrative  . None    Her Allergies Are:  No Known Allergies:    Her Current Medications Are:  Outpatient Encounter Prescriptions as of 02/02/2017  Medication Sig  . aspirin 81 MG chewable tablet Chew by mouth daily.  . calcium carbonate (OS-CAL) 600 MG TABS tablet Take 600 mg by mouth 2 (two) times daily with a meal.  . Cholecalciferol (VITAMIN D3) 50000 units CAPS Take 1 capsule by mouth.  . docusate sodium (COLACE) 100 MG  capsule Take 100 mg by mouth daily.   Marland Kitchen donepezil (ARICEPT) 5 MG tablet Take 1 tablet (5 mg total) by mouth at bedtime.  . ferrous sulfate 325 (65 FE) MG tablet Take 325 mg by mouth daily with breakfast.  . fluticasone (FLONASE) 50 MCG/ACT nasal spray Place into both nostrils daily.  Marland Kitchen gabapentin (NEURONTIN) 300 MG capsule Take 300 mg by mouth 2 (two) times daily.   Marland Kitchen GLUCAGEN HYPOKIT 1 MG SOLR injection   . guaiFENesin 200 MG tablet Take 200 mg by mouth every 4 (four) hours as needed for cough or to loosen phlegm.  Marland Kitchen HUMALOG KWIKPEN 100 UNIT/ML KiwkPen   . LANTUS SOLOSTAR 100 UNIT/ML Solostar Pen 18 units at bedtime 24 units at morning  . lisinopril (PRINIVIL,ZESTRIL) 20 MG tablet Take 20 mg by mouth daily.  Marland Kitchen loratadine (CLARITIN) 10 MG tablet Take 10 mg by mouth daily.  Marland Kitchen lovastatin (MEVACOR) 40 MG tablet Take 40 mg by mouth at bedtime.  Marland Kitchen MELATONIN PO Take 5 mg by mouth.   . memantine (NAMENDA XR) 28 MG CP24 24 hr capsule Take 28 mg by mouth daily.  . metFORMIN (GLUCOPHAGE) 1000 MG tablet Take 1,000 mg by mouth 2 (two) times daily with a meal.  . ondansetron (ZOFRAN) 4 MG tablet Take 4 mg by mouth every 8 (eight) hours as needed for nausea or vomiting.  . pantoprazole (PROTONIX) 40 MG tablet   . polyethylene glycol powder (GLYCOLAX/MIRALAX) powder   . senna (SENOKOT) 8.6 MG tablet Take 1 tablet by mouth daily.  . sorbitol 70 % solution Take 15 mLs by mouth daily as needed.  . traMADol (ULTRAM) 50 MG tablet Take 50 mg by mouth every 6 (six) hours as needed.  . traZODone (DESYREL) 50 MG tablet Take 50 mg by mouth at bedtime.   . Venlafaxine HCl 225 MG TB24   . vitamin B-12 (CYANOCOBALAMIN) 1000 MCG tablet Take 1,000 mcg by mouth daily.  . vitamin C (ASCORBIC ACID) 500 MG tablet Take 500 mg by mouth daily.  . Vitamin D, Ergocalciferol, (DRISDOL) 50000 UNITS CAPS capsule Take 50,000 Units by mouth every 7 (seven) days.  . [DISCONTINUED] Ferrous Fumarate (HEMOCYTE - 106 MG FE) 324 (106 Fe) MG TABS tablet Take 1 tablet by mouth.  . [DISCONTINUED] HYDROcodone-acetaminophen (NORCO/VICODIN) 5-325 MG per tablet Take 1 tablet by mouth every 4 (four) hours as needed for moderate pain.  . [DISCONTINUED] Melatonin 3 MG CAPS Take by mouth.   No facility-administered encounter medications on file as of 02/02/2017.   :  Review of Systems:  Out of a complete 14 point review of systems, all are reviewed and negative with the exception of these symptoms as listed below: Review of Systems  Neurological:       Pt presents today to discuss her memory. Pt says that her memory is "not good."    Objective:  Neurologic Exam  Physical Exam Physical Examination:   Vitals:   02/02/17 1125  BP: 132/68  Pulse: 96    General Examination: The patient is a very pleasant 76 y.o. female in no acute distress. She appears deconditioned, adequately groomed, situated in her wheelchair. Does not have her dentures in.  HEENT: Normocephalic, atraumatic, pupils are equal, round and reactive to light and accommodation. Extraocular tracking is mildly impaired. She has difficulty tracking. Hearing is impaired. Face is symmetrical, she has edentulous speech. She has no tremor. Neck is not rigid. She has mild to moderate mouth dryness, no corrective eyeglasses in  place. Tongue is central and palate elevates symmetrically.   Chest: Clear to auscultation without wheezing, rhonchi or crackles noted.  Heart: S1+S2+0, regular and normal without murmurs, rubs or gallops noted.   Abdomen: Soft, non-tender and non-distended with normal bowel sounds  appreciated on auscultation.  Extremities: There is no pitting edema in the distal lower extremities bilaterally. Pedal pulses are intact.  Skin: Warm and dry without trophic changes noted.  Musculoskeletal: exam reveals no obvious joint deformities, tenderness or joint swelling or erythema.   Neurologically:  Mental status: The patient is awake, alert, paying good attention, interactive. Her immediate and remote memory, attention, language skills and fund of knowledge are impaired, she is oriented to date, situation, day of week, month, year. Speech is mildly hypophonic and edentulous, otherwise unremarkable. Mood is not depressed, affect not particularly anxious today.   On 10/28/14: Her MMSE (Mini-Mental state exam) score is 19/30. CDT (Clock Drawing Test) score is 3/4. AFT (Animal Fluency Test) score is 7. Geriatric Depression Scale Score is 8.   On 07/23/2015: MMSE: 20/30, CDT: 1/4, AFT: 8/min, GDS: 9/15.   On 11/26/2015: MMSE: 20/30, CDT: 3/4, AFT: 5/min.  On 03/31/2016: MMSE: 28/30, CDT: 3/4, AFT: 8/min.  On 09/29/2016: MMSE: 21/30, CDT: 2/4, AFT: 5/min.  Cranial nerves II - XII are as described above under HEENT exam. In addition: shoulder shrug is normal with equal shoulder height noted. Motor exam:  thin bulk, global strength of 4 out of 5 throughout, reflexes 1+ throughout. Fine motor skills are impaired globally, she is unable to stand or walk for me. She did not bring a walker. Sensory exam is intact to light touch, she has no resting tremor. She has no postural or action tremor.  Cerebellar testing: No dysmetria or intention tremor.    Assessment and Plan:    In summary, Shelby Perkins is a very pleasant 76 y.o.-year old female with an underlying medical history of peripheral artery disease, prior smoking until May 2015, hypertension, hyperlipidemia, depression, and low back pain, who presents for follow-up consultation of her dementia without behavioral disturbance. She  has been on high dose Effexor for quite some time, she was in the past and generic Celexa. Of note, she had neuropsychological testing under Dr. Valentina Shaggy on 02/19/2015 as well as a follow-up for discussion on 02/27/2015 and the impression was cognitive impairment in multiple domains, predominately in the areas of processing speed and executive functioning. She had been on long-acting Namenda 28 mg daily and during her last appointment in October 2017 I suggested we start her on low-dose Aricept generic. Brain MRI was done in the past, showing prominent vascular changes, global atrophy, mph not fully excluded. She has had some issues sleeping at night, she has been on melatonin as well as trazodone at night. Her weight has been stabilized and she has slowly gained weight in the past 2 years. I suggested we increase her Aricept to 10 mg once daily and continue with Namenda long-acting. I suggested a 6 month follow-up, sooner as needed. I answered all their questions today and the patient and her son were in agreement.  I spent 25 minutes in total face-to-face time with the patient, more than 50% of which was spent in counseling and coordination of care, reviewing test results, reviewing medication and discussing or reviewing the diagnosis of dementia, its prognosis and treatment options. Pertinent laboratory and imaging test results that were available during this visit with the patient were reviewed by me and considered in  my medical decision making (see chart for details).

## 2017-06-26 DEATH — deceased

## 2017-07-05 ENCOUNTER — Telehealth: Payer: Self-pay | Admitting: Neurology

## 2017-07-05 NOTE — Telephone Encounter (Signed)
Shelby Perkins patient's son called to advise patient passed away on 05/29/2017.

## 2017-08-10 ENCOUNTER — Ambulatory Visit: Payer: Medicare Other | Admitting: Neurology
